# Patient Record
Sex: Female | Born: 1980 | Hispanic: Yes | Marital: Single | Smoking: Former smoker
Health system: Southern US, Community
[De-identification: ages and names within clinical notes are randomized; demographics above are authoritative.]

## PROBLEM LIST (undated history)

## (undated) DIAGNOSIS — Z789 Other specified health status: Secondary | ICD-10-CM

## (undated) DIAGNOSIS — D649 Anemia, unspecified: Secondary | ICD-10-CM

## (undated) HISTORY — PX: NO PAST SURGERIES: SHX2092

## (undated) HISTORY — DX: Anemia, unspecified: D64.9

---

## 2007-06-20 ENCOUNTER — Emergency Department: Payer: Self-pay | Admitting: Emergency Medicine

## 2007-06-30 ENCOUNTER — Emergency Department: Payer: Self-pay | Admitting: Emergency Medicine

## 2010-05-25 ENCOUNTER — Emergency Department: Payer: Self-pay | Admitting: Emergency Medicine

## 2010-05-26 ENCOUNTER — Other Ambulatory Visit: Payer: Self-pay | Admitting: Unknown Physician Specialty

## 2016-12-24 NOTE — L&D Delivery Note (Addendum)
Delivery Note At 3:54 AM a viable and girl  was delivered via Vaginal, Spontaneous Delivery (Presentation: ;  ).  APGAR:8/9 , ; weight  3580 gm .   Placenta status intact: , .  Cord:  with the following complications: .precipitous from 5 cm to delivery . Delivery byRN and I arrived 1 min post delivery    Anesthesia:  none Episiotomy:  none Lacerations: 1st degree Suture Repair: 3.0 vicryl Est. Blood Loss (mL):    Mom to postpartum.  Baby to Couplet care / Skin to Skin.  Cheryl Vasquez 09/30/2017, 4:06 AM

## 2017-03-06 LAB — OB RESULTS CONSOLE ABO/RH: RH Type: POSITIVE

## 2017-03-06 LAB — OB RESULTS CONSOLE ANTIBODY SCREEN: Antibody Screen: NEGATIVE

## 2017-03-06 LAB — OB RESULTS CONSOLE RPR: RPR: NONREACTIVE

## 2017-03-06 LAB — OB RESULTS CONSOLE HGB/HCT, BLOOD: Hemoglobin: 12.4

## 2017-03-06 LAB — OB RESULTS CONSOLE HEPATITIS B SURFACE ANTIGEN: Hepatitis B Surface Ag: NEGATIVE

## 2017-03-07 ENCOUNTER — Other Ambulatory Visit: Payer: Self-pay | Admitting: Advanced Practice Midwife

## 2017-03-07 DIAGNOSIS — Z369 Encounter for antenatal screening, unspecified: Secondary | ICD-10-CM

## 2017-03-07 LAB — OB RESULTS CONSOLE GC/CHLAMYDIA
Chlamydia: NEGATIVE
Gonorrhea: NEGATIVE

## 2017-03-07 LAB — OB RESULTS CONSOLE TSH: TSH: 1.04

## 2017-03-08 ENCOUNTER — Other Ambulatory Visit: Payer: Self-pay | Admitting: Advanced Practice Midwife

## 2017-03-08 DIAGNOSIS — Z369 Encounter for antenatal screening, unspecified: Secondary | ICD-10-CM

## 2017-04-04 ENCOUNTER — Ambulatory Visit: Payer: Self-pay

## 2017-07-18 LAB — OB RESULTS CONSOLE HIV ANTIBODY (ROUTINE TESTING): HIV: NONREACTIVE

## 2017-07-19 LAB — OB RESULTS CONSOLE RPR: RPR: NONREACTIVE

## 2017-08-07 LAB — OB RESULTS CONSOLE HGB/HCT, BLOOD: Hemoglobin: 11.5

## 2017-09-14 LAB — OB RESULTS CONSOLE GBS: GBS: NEGATIVE

## 2017-09-15 LAB — OB RESULTS CONSOLE GC/CHLAMYDIA
CHLAMYDIA, DNA PROBE: NEGATIVE
GC PROBE AMP, GENITAL: NEGATIVE

## 2017-09-30 ENCOUNTER — Encounter: Payer: Self-pay | Admitting: Obstetrics and Gynecology

## 2017-09-30 ENCOUNTER — Inpatient Hospital Stay
Admission: EM | Admit: 2017-09-30 | Discharge: 2017-10-02 | DRG: 807 | Disposition: A | Discharge status: Home / Self Care | Payer: Medicaid Other | Attending: Obstetrics and Gynecology | Admitting: Obstetrics and Gynecology

## 2017-09-30 DIAGNOSIS — Z87891 Personal history of nicotine dependence: Secondary | ICD-10-CM

## 2017-09-30 DIAGNOSIS — Z3A38 38 weeks gestation of pregnancy: Secondary | ICD-10-CM

## 2017-09-30 DIAGNOSIS — O26893 Other specified pregnancy related conditions, third trimester: Secondary | ICD-10-CM | POA: Diagnosis present

## 2017-09-30 DIAGNOSIS — O479 False labor, unspecified: Secondary | ICD-10-CM | POA: Diagnosis present

## 2017-09-30 HISTORY — DX: Other specified health status: Z78.9

## 2017-09-30 LAB — CBC
HEMATOCRIT: 36.4 % (ref 35.0–47.0)
Hemoglobin: 13 g/dL (ref 12.0–16.0)
MCH: 32.4 pg (ref 26.0–34.0)
MCHC: 35.7 g/dL (ref 32.0–36.0)
MCV: 90.9 fL (ref 80.0–100.0)
Platelets: 245 10*3/uL (ref 150–440)
RBC: 4 MIL/uL (ref 3.80–5.20)
RDW: 14.1 % (ref 11.5–14.5)
WBC: 10.7 10*3/uL (ref 3.6–11.0)

## 2017-09-30 LAB — TYPE AND SCREEN
ABO/RH(D): A POS
Antibody Screen: NEGATIVE

## 2017-09-30 MED ORDER — SOD CITRATE-CITRIC ACID 500-334 MG/5ML PO SOLN: 30.0000 mL | ORAL | Status: DC | PRN

## 2017-09-30 MED ORDER — ONDANSETRON HCL 4 MG PO TABS: 4.0000 mg | ORAL_TABLET | ORAL | Status: DC | PRN

## 2017-09-30 MED ORDER — LACTATED RINGERS IV SOLN: 500.0000 mL | INTRAVENOUS | Status: DC | PRN

## 2017-09-30 MED ORDER — ONDANSETRON HCL 4 MG/2ML IJ SOLN: 4.0000 mg | INTRAMUSCULAR | Status: DC | PRN

## 2017-09-30 MED ORDER — OXYCODONE-ACETAMINOPHEN 5-325 MG PO TABS: 2.0000 | ORAL_TABLET | ORAL | Status: DC | PRN

## 2017-09-30 MED ORDER — PRENATAL MULTIVITAMIN CH
1.0000 | ORAL_TABLET | Freq: Every day | ORAL | Status: DC
  Administered 2017-09-30 – 2017-10-02 (×3): 1 via ORAL
  Filled 2017-09-30 (×3): qty 1

## 2017-09-30 MED ORDER — COCONUT OIL OIL: 1.0000 "application " | TOPICAL_OIL | Status: DC | PRN

## 2017-09-30 MED ORDER — MISOPROSTOL 200 MCG PO TABS
ORAL_TABLET | ORAL | Status: AC
  Filled 2017-09-30: qty 4

## 2017-09-30 MED ORDER — ACETAMINOPHEN 325 MG PO TABS
650.0000 mg | ORAL_TABLET | ORAL | Status: DC | PRN
  Administered 2017-09-30: 650 mg via ORAL
  Filled 2017-09-30 (×2): qty 2

## 2017-09-30 MED ORDER — LIDOCAINE HCL (PF) 1 % IJ SOLN: 30.0000 mL | INTRAMUSCULAR | Status: DC | PRN

## 2017-09-30 MED ORDER — DIPHENHYDRAMINE HCL 25 MG PO CAPS: 25.0000 mg | ORAL_CAPSULE | Freq: Four times a day (QID) | ORAL | Status: DC | PRN

## 2017-09-30 MED ORDER — OXYTOCIN BOLUS FROM INFUSION
500.0000 mL | Freq: Once | INTRAVENOUS | Status: AC
  Administered 2017-09-30: 500 mL via INTRAVENOUS

## 2017-09-30 MED ORDER — IBUPROFEN 400 MG PO TABS
600.0000 mg | ORAL_TABLET | Freq: Four times a day (QID) | ORAL | Status: DC
  Administered 2017-09-30 (×2): 600 mg via ORAL
  Administered 2017-10-01: 09:00:00 200 mg via ORAL
  Administered 2017-10-01 (×3): 600 mg via ORAL
  Administered 2017-10-01: 09:00:00 400 mg via ORAL
  Administered 2017-10-02 (×2): 600 mg via ORAL
  Filled 2017-09-30 (×8): qty 1

## 2017-09-30 MED ORDER — SENNOSIDES-DOCUSATE SODIUM 8.6-50 MG PO TABS
2.0000 | ORAL_TABLET | ORAL | Status: DC
  Administered 2017-09-30 – 2017-10-01 (×2): 2 via ORAL
  Filled 2017-09-30 (×2): qty 2

## 2017-09-30 MED ORDER — OXYCODONE-ACETAMINOPHEN 5-325 MG PO TABS: 1.0000 | ORAL_TABLET | ORAL | Status: DC | PRN

## 2017-09-30 MED ORDER — LACTATED RINGERS IV SOLN: INTRAVENOUS | Status: DC

## 2017-09-30 MED ORDER — AMMONIA AROMATIC IN INHA
RESPIRATORY_TRACT | Status: AC
  Filled 2017-09-30: qty 10

## 2017-09-30 MED ORDER — BUTORPHANOL TARTRATE 1 MG/ML IJ SOLN: 1.0000 mg | INTRAMUSCULAR | Status: DC | PRN

## 2017-09-30 MED ORDER — MEASLES, MUMPS & RUBELLA VAC ~~LOC~~ INJ: 0.5000 mL | INJECTION | Freq: Once | SUBCUTANEOUS | Status: DC

## 2017-09-30 MED ORDER — LACTATED RINGERS IV SOLN
INTRAVENOUS | Status: DC
  Administered 2017-09-30: 125 mL/h via INTRAVENOUS

## 2017-09-30 MED ORDER — LIDOCAINE HCL (PF) 1 % IJ SOLN
30.0000 mL | INTRAMUSCULAR | Status: DC | PRN
  Filled 2017-09-30: qty 30

## 2017-09-30 MED ORDER — ONDANSETRON HCL 4 MG/2ML IJ SOLN: 4.0000 mg | Freq: Four times a day (QID) | INTRAMUSCULAR | Status: DC | PRN

## 2017-09-30 MED ORDER — OXYTOCIN 40 UNITS IN LACTATED RINGERS INFUSION - SIMPLE MED: 2.5000 [IU]/h | INTRAVENOUS | Status: DC

## 2017-09-30 MED ORDER — BENZOCAINE-MENTHOL 20-0.5 % EX AERO: 1.0000 "application " | INHALATION_SPRAY | CUTANEOUS | Status: DC | PRN

## 2017-09-30 MED ORDER — OXYTOCIN 40 UNITS IN LACTATED RINGERS INFUSION - SIMPLE MED
2.5000 [IU]/h | INTRAVENOUS | Status: DC
  Filled 2017-09-30: qty 1000

## 2017-09-30 MED ORDER — IBUPROFEN 400 MG PO TABS
600.0000 mg | ORAL_TABLET | Freq: Four times a day (QID) | ORAL | Status: DC
  Administered 2017-09-30: 600 mg via ORAL
  Filled 2017-09-30: qty 1

## 2017-09-30 MED ORDER — OXYTOCIN 10 UNIT/ML IJ SOLN
INTRAMUSCULAR | Status: AC
Start: 2017-09-30 — End: 2017-09-30
  Filled 2017-09-30: qty 2

## 2017-09-30 MED ORDER — SIMETHICONE 80 MG PO CHEW: 80.0000 mg | CHEWABLE_TABLET | ORAL | Status: DC | PRN

## 2017-09-30 MED ORDER — ACETAMINOPHEN 325 MG PO TABS: 650.0000 mg | ORAL_TABLET | ORAL | Status: DC | PRN

## 2017-09-30 MED ORDER — FERROUS SULFATE 325 (65 FE) MG PO TABS
325.0000 mg | ORAL_TABLET | Freq: Two times a day (BID) | ORAL | Status: DC
  Administered 2017-09-30 – 2017-10-02 (×5): 325 mg via ORAL
  Filled 2017-09-30 (×5): qty 1

## 2017-09-30 MED ORDER — DIBUCAINE 1 % RE OINT: 1.0000 "application " | TOPICAL_OINTMENT | RECTAL | Status: DC | PRN

## 2017-09-30 MED ORDER — ZOLPIDEM TARTRATE 5 MG PO TABS: 5.0000 mg | ORAL_TABLET | Freq: Every evening | ORAL | Status: DC | PRN

## 2017-09-30 MED ORDER — WITCH HAZEL-GLYCERIN EX PADS: 1.0000 "application " | MEDICATED_PAD | CUTANEOUS | Status: DC | PRN

## 2017-09-30 MED ORDER — OXYTOCIN BOLUS FROM INFUSION: 500.0000 mL | Freq: Once | INTRAVENOUS | Status: DC

## 2017-09-30 MED ORDER — MAGNESIUM HYDROXIDE 400 MG/5ML PO SUSP: 30.0000 mL | ORAL | Status: DC | PRN

## 2017-09-30 NOTE — OB Triage Note (Signed)
Pt presents to L&D with c/o contractions, rating pain 6/10. Pt triaged with interpreter Vicente Serene, ID: [id number]. Pt denies leaking of fluid or decreased fetal movement. Pt has noted light pink vaginal discharge. Toco and EFM monitors applied. Will monitor patient closely.

## 2017-09-30 NOTE — Progress Notes (Signed)
RN called to bedside at 0352 by patient's daughter stating "she feels like she's coming." RN responded to bedside immediately and noted at 0353 fetal head at +5 station. RN requested for assistance, MD and nursery notified via phone. Pt delivered at 0354, with no maternal pushing effort, RN at bedside.

## 2017-09-30 NOTE — Discharge Summary (Signed)
  Obstetric Discharge Summary   Patient ID: Patient Name: Cheryl Vasquez DOB: 1981/07/22 MRN: [id number]  Date of Admission: 09/30/2017 Date of Discharge: 10/02/17 Primary OB:  [facility]  Gestational Age at Delivery: [redacted]w[redacted]d   Antepartum complications: none Admitting Diagnosis: labor  Secondary Diagnoses: Patient Active Problem List   Diagnosis Date Noted  . Labor and delivery, indication for care 09/30/2017  . Uterine contractions during pregnancy 09/30/2017    Augmentation: none Complications: None Intrapartum complications/course:  Precipitous labor and delivery Date of Delivery: 10/02/2017  Delivered By:RN / Schermerhorn MD Delivery Type: spontaneous vaginal delivery Anesthesia: epidural Placenta: sponatneous Laceration:  Episiotomy: none  Newborn Data: Live born female  Birth Weight: 7 lb 14.3 oz (3580 g) APGAR: 8, 9  Newborn Delivery   Birth date/time:  09/30/2017 03:54:00 Delivery type:  Vaginal, Spontaneous Delivery          Postpartum Course  Patient had an uncomplicated postpartum course.  By time of discharge on PPD#2, her pain was controlled on oral pain medications; she had appropriate lochia and was ambulating, voiding without difficulty and tolerating regular diet.  She was deemed stable for discharge to home.     Labs: CBC Latest Ref Rng & Units 10/01/2017 09/30/2017 08/07/2017  WBC 3.6 - 11.0 K/uL 10.5 10.7 -  Hemoglobin 12.0 - 16.0 g/dL 13.2 44.0 10.2  Hematocrit 35.0 - 47.0 % 38.2 36.4 -  Platelets 150 - 440 K/uL 250 245 -   A POS  Physical exam:  BP (!) 112/56   Pulse 63   Temp 97.6 F (36.4 C) (Oral)   Resp 18   Ht  (1.549 m)   Wt 80.7 kg (178 lb)   SpO2 99%   Breastfeeding? Unknown   BMI 33.63 kg/m  General: alert and no distress Pulm: normal respiratory effort Lochia: appropriate Abdomen: soft, NT Uterine Fundus: firm, below umbilicus Extremities: No evidence of DVT seen on physical exam. No lower extremity  edema.   Disposition: stable, discharge to home Baby Feeding: breastmilk  Baby Disposition: home with mom  Contraception: nexplanon  Prenatal Labs:  ABO, Rh: A/Positive/-- (03/14 0000) Antibody: Negative (03/14 0000) Rubella: immune RPR: Nonreactive (07/27 0000)  HBsAg: Negative (03/14 0000)  HIV: Non-reactive (07/26 0000)  GBS: Negative (09/22 0000)    Plan:  Cheryl Vasquez was discharged to home in good condition. Follow-up appointment at [facility] in 6 weeks  Discharge Instructions: Per After Visit Summary. Activity: Advance as tolerated. Pelvic rest for 6 weeks.  Refer to After Visit Summary Diet: Regular Discharge Medications: Allergies as of 10/02/2017   No Known Allergies     Medication List    TAKE these medications   prenatal multivitamin Tabs tablet Take 1 tablet by mouth daily at 12 noon.      Outpatient follow up:  Follow-up Information    Schermerhorn, Ihor Austin, MD Follow up in 6 week(s).   Specialty:  Obstetrics and Gynecology Contact information: [address] [place] [address] [phone]            Signed:  Elenora Fender Jinna Weinman

## 2017-09-30 NOTE — Progress Notes (Signed)
Pt moved from [facility] to [facility] for admission.

## 2017-09-30 NOTE — H&P (Signed)
Cheryl Vasquez is a 36 y.o. female presenting foractiv elabor  38+5 weeks  OB History    Gravida Para Term Preterm AB Living   SAB TAB Ectopic Multiple Live Births                 Past Medical History:  Diagnosis Date  . Medical history non-contributory    Past Surgical History:  Procedure Laterality Date  . NO PAST SURGERIES     Family History: family history is not on file. Social History:  reports that she quit smoking about 7 months ago. She has never used smokeless tobacco. She reports that she does not drink alcohol or use drugs.     Maternal Diabetes: No Genetic Screening: Normal Maternal Ultrasounds/Referrals: Normal Fetal Ultrasounds or other Referrals:  None Maternal Substance Abuse:  No Significant Maternal Medications:  None Significant Maternal Lab Results:  None Other Comments:  None  ROS History Dilation: 5 Effacement (%): 100 Station: 0 Exam by:: T. Schermerhorn, MD Blood pressure 118/84, pulse 95, temperature 97.9 F (36.6 C), temperature source Oral, resp. rate 16, height  (1.549 m), weight 80.7 kg (178 lb). Exam Physical Exam  LungtsCTA  CV rrr  Abd gravid  EFM : reassuring , no decels  q 2-4 min Prenatal labs: ABO, Rh: A/Positive/-- (03/14 0000) Antibody: Negative (03/14 0000) Rubella: immune RPR: Nonreactive (07/27 0000)  HBsAg: Negative (03/14 0000)  HIV: Non-reactive (07/26 0000)  GBS: Negative (09/22 0000)   Assessment/Plan: Active labor   Anticipate SVD    Ihor Austin Schermerhorn 09/30/2017, 3:25 AM

## 2017-10-01 LAB — CBC
HEMATOCRIT: 38.2 % (ref 35.0–47.0)
HEMOGLOBIN: 13.3 g/dL (ref 12.0–16.0)
MCH: 31.8 pg (ref 26.0–34.0)
MCHC: 34.8 g/dL (ref 32.0–36.0)
MCV: 91.4 fL (ref 80.0–100.0)
Platelets: 250 10*3/uL (ref 150–440)
RBC: 4.18 MIL/uL (ref 3.80–5.20)
RDW: 14.1 % (ref 11.5–14.5)
WBC: 10.5 10*3/uL (ref 3.6–11.0)

## 2017-10-01 LAB — RPR: RPR Ser Ql: NONREACTIVE

## 2017-10-01 NOTE — Progress Notes (Signed)
Post Partum Day 1 Subjective: no complaints, up ad lib, voiding and tolerating PO  Breastfeeding well  Objective: Blood pressure 127/71, pulse 64, temperature 97.8 F (36.6 C), temperature source Oral, resp. rate 18, height  (1.549 m), weight 80.7 kg (178 lb), SpO2 100 %, unknown if currently breastfeeding.  Physical Exam:  General: alert and cooperative  Heart:S1S2, RRR, No M/R/G Lungs:CTA bilat, no W/R/R Lochia: appropriate, no clots Uterine Fundus: firm, U-2 Incision: healing well, no hematoma DVT Evaluation: No evidence of DVT seen on physical exam.   Recent Labs  09/30/17 0324 10/01/17 0427  HGB 13.0 13.3  HCT 36.4 38.2    Assessment/Plan: Plan for discharge tomorrow  Continue breastfeeding Plans Nexplanon PP   LOS: 1 day   Cheryl Vasquez 10/01/2017, 8:50 AM

## 2017-10-02 NOTE — Discharge Instructions (Signed)

## 2017-10-02 NOTE — Progress Notes (Signed)
Discharge instructions reviewed with patient.  All questions answered.

## 2018-12-14 ENCOUNTER — Encounter: Payer: Self-pay | Admitting: Emergency Medicine

## 2018-12-14 ENCOUNTER — Emergency Department
Admission: EM | Admit: 2018-12-14 | Discharge: 2018-12-14 | Disposition: A | Discharge status: Home / Self Care | Payer: Self-pay | Attending: Internal Medicine | Admitting: Internal Medicine

## 2018-12-14 ENCOUNTER — Emergency Department: Payer: PRIVATE HEALTH INSURANCE

## 2018-12-14 DIAGNOSIS — Y9389 Activity, other specified: Secondary | ICD-10-CM | POA: Insufficient documentation

## 2018-12-14 DIAGNOSIS — R079 Chest pain, unspecified: Secondary | ICD-10-CM | POA: Insufficient documentation

## 2018-12-14 DIAGNOSIS — Y999 Unspecified external cause status: Secondary | ICD-10-CM | POA: Insufficient documentation

## 2018-12-14 DIAGNOSIS — W500XXA Accidental hit or strike by another person, initial encounter: Secondary | ICD-10-CM | POA: Insufficient documentation

## 2018-12-14 DIAGNOSIS — S46912A Strain of unspecified muscle, fascia and tendon at shoulder and upper arm level, left arm, initial encounter: Secondary | ICD-10-CM | POA: Insufficient documentation

## 2018-12-14 DIAGNOSIS — Y9259 Other trade areas as the place of occurrence of the external cause: Secondary | ICD-10-CM | POA: Insufficient documentation

## 2018-12-14 DIAGNOSIS — Z87891 Personal history of nicotine dependence: Secondary | ICD-10-CM | POA: Insufficient documentation

## 2018-12-14 MED ORDER — KETOROLAC TROMETHAMINE 30 MG/ML IJ SOLN
30.0000 mg | Freq: Once | INTRAMUSCULAR | Status: AC
  Administered 2018-12-14: 30 mg via INTRAMUSCULAR
  Filled 2018-12-14: qty 1

## 2018-12-14 MED ORDER — NAPROXEN 500 MG PO TABS: 500.0000 mg | ORAL_TABLET | Freq: Two times a day (BID) | ORAL | 0 refills | Status: DC
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
## 2020-06-23 DIAGNOSIS — J40 Bronchitis, not specified as acute or chronic: Secondary | ICD-10-CM

## 2020-06-23 HISTORY — DX: Bronchitis, not specified as acute or chronic: J40

## 2020-09-23 ENCOUNTER — Ambulatory Visit (LOCAL_COMMUNITY_HEALTH_CENTER): Payer: Self-pay | Admitting: Physician Assistant

## 2020-09-23 ENCOUNTER — Other Ambulatory Visit: Payer: Self-pay

## 2020-09-23 ENCOUNTER — Encounter: Payer: Self-pay | Admitting: Physician Assistant

## 2020-09-23 VITALS — BP 118/82 | Ht 61.5 in | Wt 169.0 lb

## 2020-09-23 DIAGNOSIS — B9689 Other specified bacterial agents as the cause of diseases classified elsewhere: Secondary | ICD-10-CM

## 2020-09-23 DIAGNOSIS — Z113 Encounter for screening for infections with a predominantly sexual mode of transmission: Secondary | ICD-10-CM

## 2020-09-23 DIAGNOSIS — Z01419 Encounter for gynecological examination (general) (routine) without abnormal findings: Secondary | ICD-10-CM

## 2020-09-23 DIAGNOSIS — Z30011 Encounter for initial prescription of contraceptive pills: Secondary | ICD-10-CM

## 2020-09-23 DIAGNOSIS — Z3009 Encounter for other general counseling and advice on contraception: Secondary | ICD-10-CM

## 2020-09-23 DIAGNOSIS — N760 Acute vaginitis: Secondary | ICD-10-CM

## 2020-09-23 LAB — WET PREP FOR TRICH, YEAST, CLUE
Trichomonas Exam: NEGATIVE
Yeast Exam: NEGATIVE

## 2020-09-23 MED ORDER — METRONIDAZOLE 500 MG PO TABS: 500.0000 mg | ORAL_TABLET | Freq: Two times a day (BID) | ORAL | 0 refills | Status: AC

## 2020-09-23 MED ORDER — NORGESTIMATE-ETH ESTRADIOL 0.25-35 MG-MCG PO TABS: 1.0000 | ORAL_TABLET | Freq: Every day | ORAL | 5 refills | Status: DC

## 2020-09-23 NOTE — Progress Notes (Signed)
[facility] DEPARTMENT [place] [facility] [address] Main Number: [phone]    Family Planning Visit- Initial Visit  Subjective:  Cheryl Vasquez is a 39 y.o.  [phone]   being seen today for an initial well woman visit and to discuss family planning options.  She is currently using Condoms for pregnancy prevention. Patient reports she does not want a pregnancy in the next year.  Patient has the following medical conditions has Labor and delivery, indication for care and Uterine contractions during pregnancy on their problem list.  Chief Complaint  Patient presents with  . Contraception    PE and start OCs    Patient reports that she has decided that she would like to use OCs and think about a longer term option.  States that she has used OCs, Depo, Nexplanon and the ring in the past without problems except that she did not like not having a period.  Per patient the last time she was seen here for a visit was in October of 2010.  Is not sure when her last pap was done.  Reports that she has had a yellowish vaginal discharge for 1 week and requests STD screening.   Patient denies any other concerns today.   Body mass index is 31.42 kg/m. - Patient is eligible for diabetes screening based on BMI and age >69?  yes HA1C ordered? yes  Patient reports 2 partners in last year. Desires STI screening?  Yes  Has patient been screened once for HCV in the past?  No  No results found for: HCVAB  Does the patient have current drug use (including MJ), have a partner with drug use, and/or has been incarcerated since last result? No  If yes-- Screen for HCV through [facility]   Does the patient meet criteria for HBV testing? No  Criteria:  -Household, sexual or needle sharing contact with HBV -History of drug use -HIV positive -Those with known Hep C   Health Maintenance Due  Topic Date Due  . Hepatitis C Screening  Never done  . COVID-19  Vaccine (1) Never done  . TETANUS/TDAP  Never done  . PAP SMEAR-Modifier  Never done  . INFLUENZA VACCINE  Never done    Review of Systems  All other systems reviewed and are negative.   The following portions of the patient's history were reviewed and updated as appropriate: allergies, current medications, past family history, past medical history, past social history, past surgical history and problem list. Problem list updated.   See flowsheet for other program required questions.  Objective:   Vitals:   09/23/20 1539 09/23/20 1752  BP: 121/88 118/82  Weight: 169 lb (76.7 kg)   Height: 5' 1.5" (1.562 m)     Physical Exam Vitals reviewed.  Constitutional:      General: She is not in acute distress.    Appearance: Normal appearance.  HENT:     Head: Normocephalic and atraumatic.     Mouth/Throat:     Mouth: Mucous membranes are moist.     Pharynx: Oropharynx is clear. No oropharyngeal exudate or posterior oropharyngeal erythema.  Eyes:     Conjunctiva/sclera: Conjunctivae normal.  Neck:     Thyroid: No thyroid mass, thyromegaly or thyroid tenderness.  Cardiovascular:     Rate and Rhythm: Normal rate and regular rhythm.  Pulmonary:     Effort: Pulmonary effort is normal.     Breath sounds: Normal breath sounds.  Chest:  Breasts:        Right: Normal. No mass, nipple discharge, skin change or tenderness.        Left: Normal. No mass, nipple discharge, skin change or tenderness.  Abdominal:     Palpations: Abdomen is soft. There is no mass.     Tenderness: There is no abdominal tenderness. There is no guarding or rebound.  Genitourinary:    General: Normal vulva.     Rectum: Normal.     Comments: External genitalia/pubic area without nits, lice, edema, erythema, lesions and inguinal adenopathy. Vagina with normal mucosa and small amount of grayish discharge, pH=>4.5. Cervix without visible lesions. Uterus firm, mobile, nt, no masses, no CMT, no adnexal  tenderness or fullness. Musculoskeletal:     Cervical back: Neck supple. No tenderness.  Lymphadenopathy:     Cervical: No cervical adenopathy.     Upper Body:     Right upper body: No supraclavicular, axillary or pectoral adenopathy.     Left upper body: No supraclavicular, axillary or pectoral adenopathy.  Skin:    General: Skin is warm and dry.     Findings: No bruising, erythema, lesion or rash.  Neurological:     Mental Status: She is alert and oriented to person, place, and time.  Psychiatric:        Mood and Affect: Mood normal.        Behavior: Behavior normal.        Thought Content: Thought content normal.        Judgment: Judgment normal.       Assessment and Plan:  Cheryl Vasquez is a 39 y.o. female presenting to the [facility] Department for an initial well woman exam/family planning visit  Contraception counseling: Reviewed all forms of birth control options in the tiered based approach. available including abstinence; over the counter/barrier methods; hormonal contraceptive medication including pill, patch, ring, injection,contraceptive implant, ECP; hormonal and nonhormonal IUDs; permanent sterilization options including vasectomy and the various tubal sterilization modalities. Risks, benefits, and typical effectiveness rates were reviewed.  Questions were answered.  Written information was also given to the patient to review.  Patient desires to start OCs while researching more about LARCs, this was prescribed for patient. She will follow up in  4-5 month and prn for surveillance.  She was told to call with any further questions, or with any concerns about this method of contraception.  Emphasized use of condoms 100% of the time for STI prevention.  Patient was not a candidate for ECP today.  1. Encounter for counseling regarding contraception Counseled patient as above about all BCMs.   Reviewed with patient nl SE of OCs and when to call  clinic for irregular bleeding and SE when to d/c and call clinic. Rec condoms with all sex for 2 weeks of first cycle and then if late OCs.  2. Screening for STD (sexually transmitted disease) Await results.  Counseled that RN will call if needs to RTC for treatment once results are back.  - WET PREP FOR TRICH, YEAST, CLUE - Chlamydia/Gonorrhea [facility] - HIV/HCV [facility] - Syphilis Serology, [facility]  3. Well woman exam with routine gynecological exam Reviewed with patient healthy habits to maintain general health. Enc MVI 1 po daily. Enc to establish with/ follow up with PCP for primary care concerns, age appropriate screenings and illness. Await pap and Hgb A1c results.  Counseled that RN will call or send letter about results and  follow up recommendations. - IGP, Aptima HPV - Hgb A1c w/o eAG  4. OCP (oral contraceptive pills) initiation OK to start Sprintec 28 d 1 po daily at the same time daily at the onset of next menses.  Patient not able to receive 13 packs of OC due to supply and expiration dates.  #5 given today and patient to call when she starts last cycle to RTC for pill supply or before then if she decides she would like to start a LARC.  - norgestimate-ethinyl estradiol (SPRINTEC 28) 0.25-35 MG-MCG tablet; Take 1 tablet by mouth daily.  Dispense: 28 tablet; Refill: 5  5. BV (bacterial vaginosis) Will treat BV with Metronidazole 500 mg # 14 1 po BID for 7 days with food, no EtOH for 24 hr before and until 72 hr after completing medicine. No sex for 7 days. Enc to use OTC antifungal cream if has itching during or just after completing antibiotic. - metroNIDAZOLE (FLAGYL) 500 MG tablet; Take 1 tablet (500 mg total) by mouth 2 (two) times daily for 7 days.  Dispense: 14 tablet; Refill: 0   Return in about 4 months (around 01/24/2021) for pill supply, prn and 1 year for RP .  No future appointments.  Matt Holmes, PA

## 2020-09-24 LAB — HGB A1C W/O EAG: Hgb A1c MFr Bld: 5.2 % (ref 4.8–5.6)

## 2020-09-25 NOTE — Progress Notes (Signed)
Chart reviewed by Pharmacist  Suzanne Walker PharmD, Contract Pharmacist at [facility]  

## 2020-09-29 LAB — IGP, APTIMA HPV
HPV Aptima: NEGATIVE
PAP Smear Comment: 0

## 2020-10-04 ENCOUNTER — Telehealth: Payer: Self-pay | Admitting: Family Medicine

## 2020-10-04 ENCOUNTER — Encounter: Payer: Self-pay | Admitting: Family Medicine

## 2020-10-04 LAB — HM HEPATITIS C SCREENING LAB: HM Hepatitis Screen: NEGATIVE

## 2020-10-04 LAB — HM HIV SCREENING LAB: HM HIV Screening: NEGATIVE

## 2020-10-04 NOTE — Telephone Encounter (Signed)
Call to patient to discuss positive TR.  Patient verified by password.  Patient informed of + CT result.  Patient schedueld for treatment appointment.  Patient verbalizes understanding at this time and has no further questions. Xavier Fournier, RN  

## 2020-10-04 NOTE — Telephone Encounter (Signed)
Call to patient to inform of positive chlamydia infection.  No answer, left message on machine with interperter for patient to return call.   Wendi Snipes, RN

## 2020-10-06 ENCOUNTER — Other Ambulatory Visit: Payer: Self-pay

## 2020-10-06 ENCOUNTER — Ambulatory Visit: Payer: Self-pay | Admitting: Physician Assistant

## 2020-10-06 DIAGNOSIS — A749 Chlamydial infection, unspecified: Secondary | ICD-10-CM

## 2020-10-06 MED ORDER — AZITHROMYCIN 500 MG PO TABS
1000.0000 mg | ORAL_TABLET | Freq: Once | ORAL | Status: AC
Start: 2020-10-06 — End: 2020-10-06
  Administered 2020-10-06: 1000 mg via ORAL

## 2020-10-06 NOTE — Progress Notes (Signed)
Patient scheduled appt for partner tx this afternoon .Richmond Campbell, RN

## 2020-10-06 NOTE — Progress Notes (Signed)
Patient into clinic for treatment of Chlamydia.  RN saw patient, see her note.

## 2021-03-29 ENCOUNTER — Other Ambulatory Visit: Payer: Self-pay

## 2021-03-29 ENCOUNTER — Ambulatory Visit (LOCAL_COMMUNITY_HEALTH_CENTER): Payer: Self-pay

## 2021-03-29 VITALS — BP 108/71 | Ht 61.0 in | Wt 170.5 lb

## 2021-03-29 DIAGNOSIS — Z3201 Encounter for pregnancy test, result positive: Secondary | ICD-10-CM

## 2021-03-29 LAB — PREGNANCY, URINE: Preg Test, Ur: POSITIVE — AB

## 2021-03-29 MED ORDER — PRENATAL 27-0.8 MG PO TABS: 1.0000 | ORAL_TABLET | Freq: Every day | ORAL | 0 refills | Status: DC

## 2021-03-29 NOTE — Progress Notes (Addendum)
UPT positive. Declines interpreter today. Able to speak and understand English during visit, preferred reading material in [place]. Plans prenatal care [facility]. Consult E. Sciora, CNM d/t hx ectopic preg.Provider advises pt to establish prenatal care soon and to seek immediate medical attn/go to ER if increased abd pain and bleeding. Pt verbalizes understanding. To clerk for preadmit. Jerel Shepherd, RN   Consulted on the plan of care for this client.  I agree with the documented note and actions taken to provide care for this client.  Hazle Coca, CNM

## 2021-04-17 ENCOUNTER — Encounter: Payer: Self-pay | Admitting: Advanced Practice Midwife

## 2021-04-18 NOTE — Progress Notes (Signed)
OB abstract completed per telephone interview with patient. Henriette Combs RN

## 2021-04-25 ENCOUNTER — Other Ambulatory Visit: Payer: Self-pay

## 2021-04-25 ENCOUNTER — Ambulatory Visit: Payer: Medicaid Other | Admitting: Family Medicine

## 2021-04-25 VITALS — BP 117/79 | HR 76 | Temp 97.2°F | Wt 171.6 lb

## 2021-04-25 DIAGNOSIS — Z23 Encounter for immunization: Secondary | ICD-10-CM | POA: Diagnosis not present

## 2021-04-25 DIAGNOSIS — O99210 Obesity complicating pregnancy, unspecified trimester: Secondary | ICD-10-CM

## 2021-04-25 DIAGNOSIS — O09521 Supervision of elderly multigravida, first trimester: Secondary | ICD-10-CM | POA: Diagnosis not present

## 2021-04-25 DIAGNOSIS — O0990 Supervision of high risk pregnancy, unspecified, unspecified trimester: Secondary | ICD-10-CM | POA: Insufficient documentation

## 2021-04-25 DIAGNOSIS — O99211 Obesity complicating pregnancy, first trimester: Secondary | ICD-10-CM | POA: Diagnosis not present

## 2021-04-25 DIAGNOSIS — O09529 Supervision of elderly multigravida, unspecified trimester: Secondary | ICD-10-CM

## 2021-04-25 LAB — URINALYSIS
Bilirubin, UA: NEGATIVE
Glucose, UA: NEGATIVE
Ketones, UA: NEGATIVE
Leukocytes,UA: NEGATIVE
Nitrite, UA: NEGATIVE
Protein,UA: NEGATIVE
RBC, UA: NEGATIVE
Specific Gravity, UA: 1.02 (ref 1.005–1.030)
Urobilinogen, Ur: 0.2 mg/dL (ref 0.2–1.0)
pH, UA: 7.5 (ref 5.0–7.5)

## 2021-04-25 LAB — WET PREP FOR TRICH, YEAST, CLUE
Trichomonas Exam: NEGATIVE
Yeast Exam: NEGATIVE

## 2021-04-25 LAB — HEMOGLOBIN, FINGERSTICK: Hemoglobin: 12.5 g/dL (ref 11.1–15.9)

## 2021-04-25 NOTE — Progress Notes (Addendum)
[facility] HEALTH DEPT [facility] [address] [place] [address] [place] [address] [phone]  INITIAL PRENATAL VISIT NOTE  Subjective:  Cheryl Vasquez is a 40 y.o. [id number] at [redacted]w[redacted]d being seen today to start prenatal care at the [facility].  She is currently monitored for the following issues for this high-risk pregnancy and has Labor and delivery, indication for care; Uterine contractions during pregnancy; and High-risk pregnancy, multigravida of advanced maternal age, antepartum on their problem list.  Patient reports no complaints.  Contractions: Not present. Vag. Bleeding: None.  Movement: Absent. Denies leaking of fluid.   Indications for ASA therapy (per uptodate) One of the following: Previous pregnancy with preeclampsia, especially early onset and with an adverse outcome No Multifetal gestation No Chronic hypertension No Type 1 or 2 diabetes mellitus No Chronic kidney disease No Autoimmune disease (antiphospholipid syndrome, systemic lupus erythematosus) No  Two or more of the following: Nulliparity No Obesity (body mass index >30 kg/m2) Q Qq Q   Family history of preeclampsia in mother or sister Yes Age ?35 years Yes Sociodemographic characteristics (African American race, low socioeconomic level) Yes Personal risk factors (eg, previous pregnancy with low birth weight or small for gestational age infant, previous adverse pregnancy outcome [eg, stillbirth], interval >10 years between pregnancies) No   The following portions of the patient's history were reviewed and updated as appropriate: allergies, current medications, past family history, past medical history, past social history, past surgical history and problem list. Problem list updated.  Objective:   Vitals:   04/25/21 0836  BP: 117/79  Pulse: 76  Temp: (!) 97.2 F (36.2 C)  Weight: 171 lb 9.6 oz (77.8 kg)    Fetal Status: Fetal Heart  Rate (bpm): 162 Fundal Height: 11 cm Movement: Absent  Presentation: Undeterminable  Physical Exam Vitals and nursing note reviewed.  Constitutional:      General: She is not in acute distress.    Appearance: Normal appearance. She is well-developed.  HENT:     Head: Normocephalic and atraumatic.     Right Ear: External ear normal.     Left Ear: External ear normal.     Nose: Nose normal. No congestion or rhinorrhea.     Mouth/Throat:     Lips: Pink.     Mouth: Mucous membranes are moist.     Dentition: Normal dentition. No dental caries.     Pharynx: Oropharynx is clear. Uvula midline.     Comments: Dentition: no visible caries  Eyes:     General: No scleral icterus.    Conjunctiva/sclera: Conjunctivae normal.  Neck:     Thyroid: No thyroid mass, thyromegaly or thyroid tenderness.  Cardiovascular:     Rate and Rhythm: Normal rate.     Pulses: Normal pulses.     Comments: Extremities are warm and well perfused Pulmonary:     Effort: Pulmonary effort is normal.     Breath sounds: Normal breath sounds.  Chest:     Chest wall: No mass.  Breasts:     Tanner Score is 5. Breasts are symmetrical.     Right: Normal. No mass, nipple discharge, skin change or axillary adenopathy.     Left: Normal. No mass, nipple discharge, skin change or axillary adenopathy.    Abdominal:     General: Abdomen is flat.     Palpations: Abdomen is soft.     Tenderness: There is no abdominal tenderness.     Comments: Gravid  Genitourinary:    General: Normal vulva.     Exam position: Lithotomy position.     Pubic Area: No rash.      Labia:        Right: No rash.        Left: No rash.      Vagina: Normal. No vaginal discharge.     Cervix: No cervical motion tenderness or friability.     Uterus: Normal. Enlarged (Gravid 12 size). Not tender.      Adnexa: Right adnexa normal and left adnexa normal.     Rectum: Normal. No external hemorrhoid.  Musculoskeletal:     Right lower leg: No edema.      Left lower leg: No edema.  Lymphadenopathy:     Cervical: No cervical adenopathy.     Upper Body:     Right upper body: No axillary adenopathy.     Left upper body: No axillary adenopathy.  Skin:    General: Skin is warm.     Capillary Refill: Capillary refill takes less than 2 seconds.  Neurological:     Mental Status: She is alert and oriented to person, place, and time.  Psychiatric:        Behavior: Behavior normal.     Assessment and Plan:  Pregnancy: [id number] at [redacted]w[redacted]d  1. Supervision of high risk pregnancy, antepartum  - Dating: 02/01/21 LMP, sure of LMP , no U/S, no ED visit  - Genetic screening: denies any family history of abnormalities, desires first screen.   - Pregnancy sx: reports nausea, dizziness when walking and headaches 1-2 x weekly, information sheet given with approved pregnancy medications, nausea prevention and encouraged to increase hydration and protein.  Denies any other symptoms at this time.   - ~ 1 yr ago was last  dental care, encourage to continue routine dental care. Reviewed safety of routine care in pregnancy  - Has minor support system from family, FOB left when found out about pregnancy.   - Vaccinations: previous covid vaccines, flu vaccine given today.   - Has two minor risk factors for preeclampsia. Recommended ASA 81mg  daily for preeclampsia prevention. Discussed starting at 11-13 weeks and continuing through pregnancy. We will touch base at next appointment to see if she started this medication.  -no previous hx of cesarean  LBW or PTL  Last pap 09/23/2020 next due 09/24/2023  - Routine labs today  - Prenatal profile without Varicella/Rubella 11/24/2023) - HIV Antibody (routine testing w rflx) - HCV Ab w Reflex to Quant PCR - Urine Culture - Chlamydia/GC NAA, Confirmation - Glucose, 1 hour gestational - Comprehensive metabolic panel - TSH - Protein / creatinine ratio, urine  (Spot) - Lead, blood (adult age 74 yrs or greater) - Hgb A1c w/o  eAG - Urinalysis (Urine Dip) - WET PREP FOR TRICH, YEAST, CLUE - Hemoglobin, venipuncture - 12 7+Oxycodone-Bund - HIV-1/HIV-2 Qualitative RNA  2. High-risk pregnancy, multigravida of advanced maternal age, antepartum  3. Obesity affecting pregnancy, antepartum  - Amb ref to Medical Nutrition Therapy-MNT    Preterm labor symptoms and general obstetric precautions including but not limited to vaginal bleeding, contractions, leaking of fluid and fetal movement were reviewed in detail with the patient.  Please refer to After Visit Summary for other counseling recommendations.   Return in about 4 weeks (around 05/23/2021).  No future appointments.  05/25/2021, FNP

## 2021-04-25 NOTE — Progress Notes (Addendum)
Hgb, UA and wet mount results reviewed. Per standing orders no treatment indicated. Tawny Hopping, RN

## 2021-04-25 NOTE — Progress Notes (Signed)
Here today for 11.6 week MH IP. Taking PNV QD. Denies ED/hospital visits since +PT. Flu vaccine given. Needs early 1hgtt. Tawny Hopping, RN

## 2021-04-26 LAB — 789231 7+OXYCODONE-BUND
Amphetamines, Urine: NEGATIVE ng/mL
BENZODIAZ UR QL: NEGATIVE ng/mL
Barbiturate screen, urine: NEGATIVE ng/mL
Cannabinoid Quant, Ur: NEGATIVE ng/mL
Cocaine (Metab.): NEGATIVE ng/mL
OPIATE SCREEN URINE: NEGATIVE ng/mL
Oxycodone/Oxymorphone, Urine: NEGATIVE ng/mL
PCP Quant, Ur: NEGATIVE ng/mL

## 2021-04-26 LAB — PROTEIN / CREATININE RATIO, URINE
Creatinine, Urine: 88.4 mg/dL
Protein, Ur: 10.7 mg/dL
Protein/Creat Ratio: 121 mg/g creat (ref 0–200)

## 2021-04-26 LAB — LEAD, BLOOD (ADULT >= 16 YRS): Lead-Whole Blood: <1 ug/dL (ref 0–4)

## 2021-04-26 NOTE — Progress Notes (Signed)
Per [facility], client has the following appts: 05/03/2021 0800 genetic counseling via phone call and 05/04/2021 1430 [place] at [facility]. Jossie Ng, RN

## 2021-04-27 LAB — CBC/D/PLT+RPR+RH+ABO+AB SCR
Antibody Screen: NEGATIVE
Basophils Absolute: 0 10*3/uL (ref 0.0–0.2)
Basos: 0 %
EOS (ABSOLUTE): 0 10*3/uL (ref 0.0–0.4)
Eos: 0 %
Hematocrit: 37.2 % (ref 34.0–46.6)
Hemoglobin: 12.5 g/dL (ref 11.1–15.9)
Hepatitis B Surface Ag: NEGATIVE
Immature Grans (Abs): 0.1 10*3/uL (ref 0.0–0.1)
Immature Granulocytes: 1 %
Lymphocytes Absolute: 1.8 10*3/uL (ref 0.7–3.1)
Lymphs: 25 %
MCH: 30.9 pg (ref 26.6–33.0)
MCHC: 33.6 g/dL (ref 31.5–35.7)
MCV: 92 fL (ref 79–97)
Monocytes Absolute: 0.5 10*3/uL (ref 0.1–0.9)
Monocytes: 6 %
Neutrophils Absolute: 4.9 10*3/uL (ref 1.4–7.0)
Neutrophils: 68 %
Platelets: 308 10*3/uL (ref 150–450)
RBC: 4.05 x10E6/uL (ref 3.77–5.28)
RDW: 12.5 % (ref 11.7–15.4)
RPR Ser Ql: NONREACTIVE
Rh Factor: POSITIVE
WBC: 7.2 10*3/uL (ref 3.4–10.8)

## 2021-04-27 LAB — GLUCOSE, 1 HOUR GESTATIONAL: Gestational Diabetes Screen: 96 mg/dL (ref 65–139)

## 2021-04-27 LAB — HCV AB W REFLEX TO QUANT PCR: HCV Ab: <0.1 s/co ratio (ref 0.0–0.9)

## 2021-04-27 LAB — COMPREHENSIVE METABOLIC PANEL
ALT: 12 IU/L (ref 0–32)
AST: 12 IU/L (ref 0–40)
Albumin/Globulin Ratio: 1.6 (ref 1.2–2.2)
Albumin: 4.2 g/dL (ref 3.8–4.8)
Alkaline Phosphatase: 93 IU/L (ref 44–121)
BUN/Creatinine Ratio: 18 (ref 9–23)
BUN: 8 mg/dL (ref 6–24)
Bilirubin Total: 0.2 mg/dL (ref 0.0–1.2)
CO2: 19 mmol/L — ABNORMAL LOW (ref 20–29)
Calcium: 8.8 mg/dL (ref 8.7–10.2)
Chloride: 100 mmol/L (ref 96–106)
Creatinine, Ser: 0.45 mg/dL — ABNORMAL LOW (ref 0.57–1.00)
Globulin, Total: 2.6 g/dL (ref 1.5–4.5)
Glucose: 102 mg/dL — ABNORMAL HIGH (ref 65–99)
Potassium: 3.9 mmol/L (ref 3.5–5.2)
Sodium: 135 mmol/L (ref 134–144)
Total Protein: 6.8 g/dL (ref 6.0–8.5)
eGFR: 123 mL/min/{1.73_m2} (ref >59)

## 2021-04-27 LAB — HIV-1/HIV-2 QUALITATIVE RNA
HIV-1 RNA, Qualitative: NONREACTIVE
HIV-2 RNA, Qualitative: NONREACTIVE

## 2021-04-27 LAB — CHLAMYDIA/GC NAA, CONFIRMATION
Chlamydia trachomatis, NAA: NEGATIVE
Neisseria gonorrhoeae, NAA: NEGATIVE

## 2021-04-27 LAB — HGB A1C W/O EAG: Hgb A1c MFr Bld: 5.2 % (ref 4.8–5.6)

## 2021-04-27 LAB — HCV INTERPRETATION

## 2021-04-27 LAB — TSH: TSH: 0.226 u[IU]/mL — ABNORMAL LOW (ref 0.450–4.500)

## 2021-05-01 DIAGNOSIS — O99210 Obesity complicating pregnancy, unspecified trimester: Secondary | ICD-10-CM | POA: Insufficient documentation

## 2021-05-01 DIAGNOSIS — E669 Obesity, unspecified: Secondary | ICD-10-CM | POA: Insufficient documentation

## 2021-05-01 LAB — URINE CULTURE

## 2021-05-02 ENCOUNTER — Encounter: Payer: Self-pay | Admitting: Family Medicine

## 2021-05-02 DIAGNOSIS — O09529 Supervision of elderly multigravida, unspecified trimester: Secondary | ICD-10-CM | POA: Insufficient documentation

## 2021-05-08 ENCOUNTER — Telehealth: Payer: Self-pay

## 2021-05-08 NOTE — Telephone Encounter (Signed)
Call to client to schedule follow-up appt in [facility] as no future appts scheduled. Client requested appt on a Thursday and scheduled for 05/25/2021 with arrival time of 0800. [employer] ID # [id number] used during call. Jossie Ng, RN

## 2021-05-25 ENCOUNTER — Other Ambulatory Visit: Payer: Self-pay

## 2021-05-25 ENCOUNTER — Encounter: Payer: Self-pay | Admitting: Physician Assistant

## 2021-05-25 ENCOUNTER — Ambulatory Visit: Payer: Medicaid Other | Admitting: Physician Assistant

## 2021-05-25 VITALS — BP 107/66 | HR 82 | Temp 98.1°F | Wt 172.0 lb

## 2021-05-25 DIAGNOSIS — O99212 Obesity complicating pregnancy, second trimester: Secondary | ICD-10-CM

## 2021-05-25 DIAGNOSIS — O0990 Supervision of high risk pregnancy, unspecified, unspecified trimester: Secondary | ICD-10-CM

## 2021-05-25 DIAGNOSIS — O0992 Supervision of high risk pregnancy, unspecified, second trimester: Secondary | ICD-10-CM | POA: Diagnosis not present

## 2021-05-25 DIAGNOSIS — R7989 Other specified abnormal findings of blood chemistry: Secondary | ICD-10-CM | POA: Diagnosis not present

## 2021-05-25 DIAGNOSIS — O99210 Obesity complicating pregnancy, unspecified trimester: Secondary | ICD-10-CM

## 2021-05-25 DIAGNOSIS — O09529 Supervision of elderly multigravida, unspecified trimester: Secondary | ICD-10-CM

## 2021-05-25 NOTE — Progress Notes (Signed)
   PRENATAL VISIT NOTE  Subjective:  Cheryl Vasquez is a 40 y.o. [phone] at [redacted]w[redacted]d being seen today for ongoing prenatal care.  She is currently monitored for the following issues for this high-risk pregnancy and has Supervision of high risk pregnancy, antepartum; Obesity affecting pregnancy, antepartum; Advanced maternal age in multigravida; and Abnormal thyroid blood test on their problem list.  Patient reports some varicose vein discomfort in legs.  Contractions: Not present. Vag. Bleeding: None.  Movement: Absent. Denies leaking of fluid/ROM.   The following portions of the patient's history were reviewed and updated as appropriate: allergies, current medications, past family history, past medical history, past social history, past surgical history and problem list. Problem list updated.  Objective:   Vitals:   05/25/21 0826  BP: 107/66  Pulse: 82  Temp: 98.1 F (36.7 C)  Weight: 172 lb (78 kg)    Fetal Status: Fetal Heart Rate (bpm): 152 Fundal Height: 16 cm Movement: Absent     General:  Alert, oriented and cooperative. Patient is in no acute distress.  Skin: Skin is warm and dry. No rash noted.   Cardiovascular: Normal heart rate noted  Respiratory: Normal respiratory effort, no problems with respiration noted  Abdomen: Soft, gravid, appropriate for gestational age.  Pain/Pressure: Absent     Pelvic: Cervical exam deferred        Extremities: Normal range of motion.  Edema: None  Mental Status: Normal mood and affect. Normal behavior. Normal judgment and thought content.   Assessment and Plan:  Pregnancy: [id number] at [redacted]w[redacted]d  1. Supervision of high risk pregnancy, antepartum AFP only today. Schedule anatomy US at 19-20 wk (before June 30). Encouraged support stockings for varicose vein discomfort. Reports has had initial 2-vaccine COVID series, unsure of dates - to bring vaccine card to next visit to see if booster is due.  2. Abnormal thyroid blood test TSH  decreased at initial visit. Check TSH, T3 and free T4 today.  3. Obesity affecting pregnancy, antepartum Taking daily low dose aspirin as instructed.  4. Antepartum multigravida of advanced maternal age Continue aspirin.  Due to language barrier, an interpreter Juliene Pina) was present during the visit with this patient.  Preterm labor symptoms and general obstetric precautions including but not limited to vaginal bleeding, contractions, leaking of fluid and fetal movement were reviewed in detail with the patient. Please refer to After Visit Summary for other counseling recommendations.  Return in about 4 weeks (around 06/22/2021) for Routine prenatal care.  No future appointments.  Landry Dyke, PA-C

## 2021-05-25 NOTE — Progress Notes (Signed)
Patient here for MH RV at 16 1/7. [facility] pager card given. AFP only today. Needs TSH, T3 and Free T4. Marland KitchenBurt Knack, RN

## 2021-05-25 NOTE — Progress Notes (Signed)
[facility] U/S referral faxed with confirmation. Patient aware to expect call from [facility]. Patient states her Medicaid ends June 30. Counseled that if [facility] U/S is after that date, she can ask at her next appt for a letter, to take to [facility] for a lower cost U/S.Marland KitchenBurt Knack, RN

## 2021-05-26 LAB — TSH+FREE T4
Free T4: 1 ng/dL (ref 0.82–1.77)
TSH: 1.34 u[IU]/mL (ref 0.450–4.500)

## 2021-05-26 LAB — T3: T3, Total: 272 ng/dL — ABNORMAL HIGH (ref 71–180)

## 2021-05-27 LAB — AFP, SERUM, OPEN SPINA BIFIDA
AFP MoM: 1.26
AFP Value: 40.7 ng/mL
Gest. Age on Collection Date: 16.1 weeks
Maternal Age At EDD: 42.6 yr
OSBR Risk 1 IN: 5411
Test Results:: NEGATIVE
Weight: 172 [lb_av]

## 2021-06-06 NOTE — Addendum Note (Signed)
Addended by: Heywood Bene on: 06/06/2021 12:02 PM   Modules accepted: Orders

## 2021-06-22 ENCOUNTER — Encounter: Payer: Self-pay | Admitting: Physician Assistant

## 2021-06-22 ENCOUNTER — Other Ambulatory Visit: Payer: Self-pay

## 2021-06-22 ENCOUNTER — Ambulatory Visit: Payer: Medicaid Other | Admitting: Physician Assistant

## 2021-06-22 VITALS — BP 106/62 | HR 82 | Temp 82.0°F | Wt 177.2 lb

## 2021-06-22 DIAGNOSIS — O0992 Supervision of high risk pregnancy, unspecified, second trimester: Secondary | ICD-10-CM | POA: Diagnosis not present

## 2021-06-22 DIAGNOSIS — O0990 Supervision of high risk pregnancy, unspecified, unspecified trimester: Secondary | ICD-10-CM

## 2021-06-22 DIAGNOSIS — O99212 Obesity complicating pregnancy, second trimester: Secondary | ICD-10-CM

## 2021-06-22 DIAGNOSIS — O09529 Supervision of elderly multigravida, unspecified trimester: Secondary | ICD-10-CM

## 2021-06-22 DIAGNOSIS — R7989 Other specified abnormal findings of blood chemistry: Secondary | ICD-10-CM | POA: Diagnosis not present

## 2021-06-22 DIAGNOSIS — O99210 Obesity complicating pregnancy, unspecified trimester: Secondary | ICD-10-CM

## 2021-06-22 MED ORDER — PRENATAL MULTIVITAMIN CH: 1.0000 | ORAL_TABLET | Freq: Every day | ORAL | 0 refills | Status: DC

## 2021-06-22 NOTE — Progress Notes (Signed)
   PRENATAL VISIT NOTE  Subjective:  Cheryl Vasquez is a 40 y.o. [phone] at [redacted]w[redacted]d being seen today for ongoing prenatal care.  She is currently monitored for the following issues for this high-risk pregnancy and has Supervision of high risk pregnancy, antepartum; Obesity affecting pregnancy, antepartum; Advanced maternal age in multigravida; and Abnormal thyroid blood test on their problem list.  Patient reports  varicose veins .  Contractions: Not present. Vag. Bleeding: None.  Movement: Present. Denies leaking of fluid/ROM.   The following portions of the patient's history were reviewed and updated as appropriate: allergies, current medications, past family history, past medical history, past social history, past surgical history and problem list. Problem list updated.  Objective:   Vitals:   06/22/21 0910  BP: 106/62  Pulse: 82  Temp: (!) 82 F (27.8 C)  Weight: 177 lb 3.2 oz (80.4 kg)    Fetal Status: Fetal Heart Rate (bpm): 152 Fundal Height: 20 cm Movement: Present     General:  Alert, oriented and cooperative. Patient is in no acute distress.  Skin: Skin is warm and dry. No rash noted.   Cardiovascular: Normal heart rate noted  Respiratory: Normal respiratory effort, no problems with respiration noted  Abdomen: Soft, gravid, appropriate for gestational age.  Pain/Pressure: Absent     Pelvic: Cervical exam deferred        Extremities: Normal range of motion.  Edema: None  Mental Status: Normal mood and affect. Normal behavior. Normal judgment and thought content.   Assessment and Plan:  Pregnancy: [id number] at [redacted]w[redacted]d  1. Supervision of high risk pregnancy, antepartum Encouraged support stockings for varicose vein discomfort. Enc po hydration in light of very hot weather. Pt desired COVID vaccine booster - completed initial series 03/2020, printed vaccine record for pt and provided vaccine clinic info to pt. - Prenatal Vit-Fe Fumarate-FA (PRENATAL MULTIVITAMIN) TABS  tablet; Take 1 tablet by mouth daily at 12 noon.  Dispense: 100 tablet; Refill: 0  2. Abnormal thyroid blood test Low TSH and elevated T3 on 05/25/21 labs. Repeat bloodwork today as planned. Pt denies sx. - TSH + free T4 - T3  3. Antepartum multigravida of advanced maternal age Taking low dose aspirin. Plan weekly NST at 36 wk, with del by EDD.  4. Obesity affecting pregnancy, antepartum Taking low dose aspirin. Appropriate weight gain this far. Plan fetal growth [place] at 32 wk.   Preterm labor symptoms and general obstetric precautions including but not limited to vaginal bleeding, contractions, leaking of fluid and fetal movement were reviewed in detail with the patient. Please refer to After Visit Summary for other counseling recommendations.  Return in about 4 weeks (around 07/20/2021) for Routine prenatal care.  No future appointments.  Landry Dyke, PA-C

## 2021-06-23 LAB — TSH+FREE T4
Free T4: 0.92 ng/dL (ref 0.82–1.77)
TSH: 0.663 u[IU]/mL (ref 0.450–4.500)

## 2021-06-23 LAB — T3: T3, Total: 253 ng/dL — ABNORMAL HIGH (ref 71–180)

## 2021-07-26 ENCOUNTER — Other Ambulatory Visit: Payer: Self-pay

## 2021-07-26 ENCOUNTER — Encounter: Payer: Self-pay | Admitting: Advanced Practice Midwife

## 2021-07-26 ENCOUNTER — Ambulatory Visit: Payer: Self-pay | Admitting: Advanced Practice Midwife

## 2021-07-26 VITALS — BP 103/59 | HR 78 | Temp 97.3°F | Wt 178.8 lb

## 2021-07-26 DIAGNOSIS — R87610 Atypical squamous cells of undetermined significance on cytologic smear of cervix (ASC-US): Secondary | ICD-10-CM

## 2021-07-26 DIAGNOSIS — F32A Depression, unspecified: Secondary | ICD-10-CM | POA: Insufficient documentation

## 2021-07-26 DIAGNOSIS — O0990 Supervision of high risk pregnancy, unspecified, unspecified trimester: Secondary | ICD-10-CM

## 2021-07-26 DIAGNOSIS — O99212 Obesity complicating pregnancy, second trimester: Secondary | ICD-10-CM

## 2021-07-26 DIAGNOSIS — O09529 Supervision of elderly multigravida, unspecified trimester: Secondary | ICD-10-CM

## 2021-07-26 DIAGNOSIS — O99342 Other mental disorders complicating pregnancy, second trimester: Secondary | ICD-10-CM

## 2021-07-26 DIAGNOSIS — O99210 Obesity complicating pregnancy, unspecified trimester: Secondary | ICD-10-CM

## 2021-07-26 DIAGNOSIS — O0992 Supervision of high risk pregnancy, unspecified, second trimester: Secondary | ICD-10-CM

## 2021-07-26 DIAGNOSIS — O99340 Other mental disorders complicating pregnancy, unspecified trimester: Secondary | ICD-10-CM

## 2021-07-26 DIAGNOSIS — R87619 Unspecified abnormal cytological findings in specimens from cervix uteri: Secondary | ICD-10-CM

## 2021-07-26 HISTORY — DX: Unspecified abnormal cytological findings in specimens from cervix uteri: R87.619

## 2021-07-26 HISTORY — DX: Other mental disorders complicating pregnancy, unspecified trimester: O99.340

## 2021-07-26 MED ORDER — PRENATAL MULTIVITAMIN CH: 1.0000 | ORAL_TABLET | Freq: Every day | ORAL | 0 refills | Status: DC

## 2021-07-26 NOTE — Progress Notes (Signed)
PRENATAL VISIT NOTE  Subjective:  Cheryl Vasquez is a 40 y.o. [phone] at [redacted]w[redacted]d being seen today for ongoing prenatal care.  She is currently monitored for the following issues for this high-risk pregnancy and has Supervision of high risk pregnancy, antepartum; Obesity affecting pregnancy, antepartum; Advanced maternal age in multigravida 40 yo; and Abnormal thyroid blood test on their problem list.  Patient reports no complaints.  Contractions: Not present. Vag. Bleeding: None.  Movement: Present. Denies leaking of fluid/ROM.   The following portions of the patient's history were reviewed and updated as appropriate: allergies, current medications, past family history, past medical history, past social history, past surgical history and problem list. Problem list updated.  Objective:   Vitals:   07/26/21 0923  BP: (!) 103/59  Pulse: 78  Temp: (!) 97.3 F (36.3 C)  Weight: 178 lb 12.8 oz (81.1 kg)    Fetal Status: Fetal Heart Rate (bpm): 160 Fundal Height: 26 cm Movement: Present     General:  Alert, oriented and cooperative. Patient is in no acute distress.  Skin: Skin is warm and dry. No rash noted.   Cardiovascular: Normal heart rate noted  Respiratory: Normal respiratory effort, no problems with respiration noted  Abdomen: Soft, gravid, appropriate for gestational age.  Pain/Pressure: Absent     Pelvic: Cervical exam deferred        Extremities: Normal range of motion.  Edema: None  Mental Status: Normal mood and affect. Normal behavior. Normal judgment and thought content.   Assessment and Plan:  Pregnancy: G5P3013 at [redacted]w[redacted]d  1. Supervision of high risk pregnancy, antepartum Pt states she has been stressed and crying because her rent was increased by $550/mo without notice and she stopped working and can't afford rent now and can't move all her belongings by herself. She went to [facility] 2 wks ago and was given rx for Lexapro 10 mg but hasn't  begun because wants to know if it is safe. Living with her 12 yo daughter who is also anxious. FOB not helping her because he is questioning paternity. PHQ-9=2 today.  States she has seen Kathreen Cosier in the past and agrees to schedule another apt with her. Referral done. Also requesting Legal Aid #--given. Provider asked RN to call Gayland Curry for possible assistance.  - Prenatal Vit-Fe Fumarate-FA (PRENATAL MULTIVITAMIN) TABS tablet; Take 1 tablet by mouth daily at 12 noon. (Patient not taking: Reported on 07/26/2021)  Dispense: 100 tablet; Refill: 0  2. Depression/anxiety affecting pregnancy Counseled pt re: safety of Lexapro during pregnancy.  PHQ-9=2 today. Note written to landlord - Ambulatory referral to Behavioral Health  3. Obesity affecting pregnancy, antepartum 4 lb 12.8 oz (2.177 kg) Visited a friend so hasn't been taking ASA 81 mg or vits past 1.5 wks--to resume today  4. Atypical squamous cells of undetermined significance on cytologic smear of cervix (ASC-US) ASCUS HPV - 09/23/20: needs cotest 09/2023  5. Antepartum multigravida of advanced maternal age Needs 32 wk growth u/s Needs weekly NST/AFI beginning 36 wks  - Prenatal Vit-Fe Fumarate-FA (PRENATAL MULTIVITAMIN) TABS tablet; Take 1 tablet by mouth daily at 12 noon. (Patient not taking: Reported on 07/26/2021)  Dispense: 100 tablet; Refill: 0   Preterm labor symptoms and general obstetric precautions including but not limited to vaginal bleeding, contractions, leaking of fluid and fetal movement were reviewed in detail with the patient. Please refer to After Visit Summary for other counseling recommendations.  No follow-ups on file.  No future appointments.  Herbie Saxon, CNM

## 2021-07-26 NOTE — Progress Notes (Signed)
Patient here at 25w 0d for [facility] RV.   [facility] high risk forms filled and PHQ9.  Preterm labor handout given.   Prenatal vitamins given today, she states she left in a another town while visiting a friend and she left also her ASA 81mg  there as well. Informed her to pick up ASA 81mg  as soon as she can.  Patient states she also had a visit at her PCP at [facility] 2 weeks ago regarding increase anxiety and depression she has been having the last month due to her landlord increasing $500 in rent with very little notice. States she was unable to make payment and has been having to look for a place to stay and move on her own. Has been crying and worried about housing situation. She has found a place but needs more time to move her belongings. Provider, , CNM made patient a letter to give her landlord requesting a few more days to move out. Patient states this will help her a lot in the moving process.   She was prescribed Lexapro 10mg  2 weeks ago and has not started taking it yet, she was unsure if it was safe to take during pregancy. Provider, [facility], CNM made aware.   Per provider, safe to take during pregnancy. Patient made aware.   F/u appointment made to return in 3 weeks for 28 week labs on 08/18/21 at 0840. Appointment reminder card given.   , RN

## 2021-08-17 ENCOUNTER — Telehealth: Payer: Self-pay

## 2021-08-17 NOTE — Telephone Encounter (Signed)
Patient states she is not feeling well, complaining of head cold, cough, hoarseness in her voice, and sore throat for past 4 days (since 8/22). Patient did two covid at-home test two days ago and have shown up negative. She states her daughter is also not feeling well with similar symptoms. Patient encourage to stay home and rest, hydrate and take medications from the "Safe medications in pregnancy List" according to her symptoms. Her appointment for 8/26 was rescheduled at patients earliest conveyance next week, patient prefers Friday appointments, appointment rescheduled for 08/25/21 at 0820.   Floy Sabina, RN

## 2021-08-18 ENCOUNTER — Ambulatory Visit: Payer: Self-pay

## 2021-08-24 ENCOUNTER — Other Ambulatory Visit: Payer: Self-pay

## 2021-08-24 ENCOUNTER — Ambulatory Visit: Payer: Self-pay | Admitting: Licensed Clinical Social Worker

## 2021-08-24 ENCOUNTER — Encounter: Payer: Self-pay | Admitting: Physician Assistant

## 2021-08-24 ENCOUNTER — Ambulatory Visit: Payer: Self-pay | Admitting: Physician Assistant

## 2021-08-24 VITALS — BP 103/68 | HR 76 | Temp 98.0°F | Wt 179.2 lb

## 2021-08-24 DIAGNOSIS — O99340 Other mental disorders complicating pregnancy, unspecified trimester: Secondary | ICD-10-CM

## 2021-08-24 DIAGNOSIS — O99213 Obesity complicating pregnancy, third trimester: Secondary | ICD-10-CM

## 2021-08-24 DIAGNOSIS — O99343 Other mental disorders complicating pregnancy, third trimester: Secondary | ICD-10-CM

## 2021-08-24 DIAGNOSIS — F32A Depression, unspecified: Secondary | ICD-10-CM

## 2021-08-24 DIAGNOSIS — O0990 Supervision of high risk pregnancy, unspecified, unspecified trimester: Secondary | ICD-10-CM

## 2021-08-24 DIAGNOSIS — O0993 Supervision of high risk pregnancy, unspecified, third trimester: Secondary | ICD-10-CM

## 2021-08-24 DIAGNOSIS — O09523 Supervision of elderly multigravida, third trimester: Secondary | ICD-10-CM

## 2021-08-24 DIAGNOSIS — O09529 Supervision of elderly multigravida, unspecified trimester: Secondary | ICD-10-CM

## 2021-08-24 DIAGNOSIS — F4322 Adjustment disorder with anxiety: Secondary | ICD-10-CM

## 2021-08-24 DIAGNOSIS — O99210 Obesity complicating pregnancy, unspecified trimester: Secondary | ICD-10-CM

## 2021-08-24 LAB — HEMOGLOBIN, FINGERSTICK: Hemoglobin: 11.7 g/dL (ref 11.1–15.9)

## 2021-08-24 NOTE — Progress Notes (Signed)
Patient here for MH RV at 29 1/7. 28 week labs today, 1 hour gtt and Tdap. [facility] pager card given, and did not get her covid booster on 08/18/2021 as planned. She states she had symptoms and did not go, then did a covid test which was negative. Plans to go in the future.Burt Knack, RN

## 2021-08-24 NOTE — Progress Notes (Signed)
Hgb result reviewed, no treatment indicated. Tdap given, tolerated well, VIS given. Patient states she has new phone number and will call [facility] with number.Burt Knack, RN
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
## 2021-08-24 NOTE — Progress Notes (Signed)
   PRENATAL VISIT NOTE  Subjective:  Cheryl Vasquez is a 40 y.o. [phone] at [redacted]w[redacted]d being seen today for ongoing prenatal care.  She is currently monitored for the following issues for this high-risk pregnancy and has Supervision of high risk pregnancy, antepartum; Obesity affecting pregnancy, antepartum; Advanced maternal age in multigravida 40 yo; Abnormal thyroid blood test; Depression/anxiety affecting pregnancy; and Abnormal Pap smear of cervix ASCUS HPV - 09/23/20 on their problem list.  Patient reports  occ foot swelling (mostly after prolonged standing), vulvar and leg varicosities, and nighttime nasal congestion .  Contractions: Not present. Vag. Bleeding: None.  Movement: Present. Denies leaking of fluid/ROM.   The following portions of the patient's history were reviewed and updated as appropriate: allergies, current medications, past family history, past medical history, past social history, past surgical history and problem list. Problem list updated.  Objective:   Vitals:   08/24/21 1341  BP: 103/68  Pulse: 76  Temp: 98 F (36.7 C)  Weight: 179 lb 3.2 oz (81.3 kg)    Fetal Status: Fetal Heart Rate (bpm): 148 Fundal Height: 30 cm Movement: Present     General:  Alert, oriented and cooperative. Patient is in no acute distress.  Skin: Skin is warm and dry. No rash noted.   Cardiovascular: Normal heart rate noted  Respiratory: Normal respiratory effort, no problems with respiration noted  Abdomen: Soft, gravid, appropriate for gestational age.  Pain/Pressure: Absent     Pelvic: Cervical exam deferred        Extremities: Normal range of motion.  Edema: Trace  Mental Status: Normal mood and affect. Normal behavior. Normal judgment and thought content.   Assessment and Plan:  Pregnancy: [id number] at [redacted]w[redacted]d  1. Supervision of high risk pregnancy, antepartum Routine 28-wk labs today.  2. Obesity affecting pregnancy, antepartum Continue aspirin. 5 lb TWG, with 1 lb  interim wt gain. Will continue to monitor. Pt reports good appetite/po intake.  3. Depression/anxiety affecting pregnancy Appropriate affect today, has appt with LCSW today.  4. Antepartum multigravida of advanced maternal age Continue aspirin. Sched 32 wk growth [place], plan to start weekly NSTs at 36 wk.   Preterm labor symptoms and general obstetric precautions including but not limited to vaginal bleeding, contractions, leaking of fluid and fetal movement were reviewed in detail with the patient. Please refer to After Visit Summary for other counseling recommendations.  Return in about 2 weeks (around 09/07/2021) for Routine prenatal care.  Future Appointments  Date Time Provider Department Center  08/24/2021  4:00 PM Kathreen Cosier, LCSW [facility] None  09/07/2021  3:00 PM [facility] PROVIDER [facility] None    Landry Dyke, PA-C

## 2021-08-25 ENCOUNTER — Ambulatory Visit: Payer: Self-pay

## 2021-08-25 ENCOUNTER — Telehealth: Payer: Self-pay

## 2021-08-25 NOTE — Telephone Encounter (Signed)
TC to patient to get her new telephone number. Old number no longer working per patient at her appt yesterday. She stated at her appt that she would call with her new number. Unable to contact patient with phone number in chart. TC to patient emergency contact, Nellie, a friend. Per Nellie, she will tell patient to call [facility] with her new phone number as soon as she sees patient. Nellie encouraged to let patient know asap and it's very important. Interpreter, Juliene Pina.Marland KitchenMarland KitchenBurt Knack, RN

## 2021-08-25 NOTE — Telephone Encounter (Signed)
Patient called back to say she won't have a new phone number until Monday. Patient informed that [facility] is closed on Monday. Patient states she will call on Tuesday 08/29/21 with her new number. Patient informed that she needs an U/S and we can't fax referral without her phone number and [facility] can't call her to schedule appointment without her phone number and that she needs to call early on Tuesday so we can fax referral. Patient states understanding and says she will call Tues. Interpreter V. Olmedo.Burt Knack, RN

## 2021-08-26 LAB — RPR: RPR Ser Ql: NONREACTIVE

## 2021-08-26 LAB — HIV-1/HIV-2 QUALITATIVE RNA
HIV-1 RNA, Qualitative: NONREACTIVE
HIV-2 RNA, Qualitative: NONREACTIVE

## 2021-08-26 LAB — GLUCOSE, 1 HOUR GESTATIONAL: Gestational Diabetes Screen: 112 mg/dL (ref 65–139)

## 2021-08-29 NOTE — Telephone Encounter (Signed)
As of 1 pm, client has not yet notified [facility] of new phone number. Call to client's cell number of record with Salli Real and per recorded message ,number is not accepting calls at this time. Call to emergency contact and left message requesting assistance contacting client to call [facility] with new phone number so [place] referral can be sent to [facility]. Number to call provided. Jossie Ng, RN

## 2021-08-31 NOTE — Telephone Encounter (Signed)
Patient now scheduled for [facility] RV on 09/07/2021. Please ask at her appointment for new phone number.Burt Knack, RN

## 2021-09-07 ENCOUNTER — Ambulatory Visit: Payer: Self-pay | Admitting: Physician Assistant

## 2021-09-07 ENCOUNTER — Other Ambulatory Visit: Payer: Self-pay

## 2021-09-07 VITALS — BP 96/66 | HR 83 | Temp 97.9°F | Wt 180.8 lb

## 2021-09-07 DIAGNOSIS — O09529 Supervision of elderly multigravida, unspecified trimester: Secondary | ICD-10-CM

## 2021-09-07 DIAGNOSIS — O99340 Other mental disorders complicating pregnancy, unspecified trimester: Secondary | ICD-10-CM

## 2021-09-07 DIAGNOSIS — O0993 Supervision of high risk pregnancy, unspecified, third trimester: Secondary | ICD-10-CM

## 2021-09-07 DIAGNOSIS — O99213 Obesity complicating pregnancy, third trimester: Secondary | ICD-10-CM

## 2021-09-07 DIAGNOSIS — O99210 Obesity complicating pregnancy, unspecified trimester: Secondary | ICD-10-CM

## 2021-09-07 DIAGNOSIS — O09523 Supervision of elderly multigravida, third trimester: Secondary | ICD-10-CM

## 2021-09-07 DIAGNOSIS — O0990 Supervision of high risk pregnancy, unspecified, unspecified trimester: Secondary | ICD-10-CM

## 2021-09-07 DIAGNOSIS — O99343 Other mental disorders complicating pregnancy, third trimester: Secondary | ICD-10-CM

## 2021-09-07 DIAGNOSIS — F32A Depression, unspecified: Secondary | ICD-10-CM

## 2021-09-07 NOTE — Progress Notes (Addendum)
Kick counts reviewed with patient and cards given. TC to [facility] to schedule U/S while patient in office. Earliest U/S available is 09/21/2021 at 4pm at [facility]. Provider aware and patient states she will go to that appointment. Patient has moved to [facility] and wishes to continue care here at [facility]. Address and new phone number updated in chart. New phone number belongs to her daughter who lives next door to patient, patient does not have another phone. Patient states she will call V. Olmedo tomorrow with her new emergency contact name and number. Burt Knack, RN

## 2021-09-07 NOTE — Progress Notes (Signed)
[facility]  PRENATAL VISIT NOTE  Subjective:  Cheryl Vasquez is a 40 y.o. [phone] at [redacted]w[redacted]d being seen today for ongoing prenatal care.  She is currently monitored for the following issues for this high-risk pregnancy and has Supervision of high risk pregnancy, antepartum; Obesity affecting pregnancy, antepartum; Advanced maternal age in multigravida 40 yo; Abnormal thyroid blood test; Depression/anxiety affecting pregnancy; and Abnormal Pap smear of cervix ASCUS HPV - 09/23/20 on their problem list.  Patient reports heartburn.  Contractions: Not present. Vag. Bleeding: None.  Movement: Present. Notes her stuffy nose and trouble sleeping are much improved after moving from home where she thought there was mold to another home. Mood has been much happier. Of note, only took Lexapro 10mg  qd for about 2 weeks due to nausea, then discontinued. Denies leaking of fluid/ROM.   The following portions of the patient's history were reviewed and updated as appropriate: allergies, current medications, past family history, past medical history, past social history, past surgical history and problem list. Problem list updated.  Objective:   Vitals:   09/07/21 1516  BP: 96/66  Pulse: 83  Temp: 97.9 F (36.6 C)  Weight: 180 lb 12.8 oz (82 kg)    Fetal Status: Fetal Heart Rate (bpm): 132 Fundal Height: 32 cm Movement: Present     General:  Alert, oriented and cooperative. Patient is in no acute distress.  Skin: Skin is warm and dry. No rash noted.   Cardiovascular: Normal heart rate noted  Respiratory: Normal respiratory effort, no problems with respiration noted  Abdomen: Soft, gravid, appropriate for gestational age.  Pain/Pressure: Absent     Pelvic: Cervical exam deferred        Extremities: Normal range of motion.  Edema: Trace  Mental Status: Normal mood and affect. Normal behavior. Normal judgment and thought content.   Assessment and  Plan:  Pregnancy: 09/09/21 at [redacted]w[redacted]d  1. Supervision of high risk pregnancy, antepartum Taking prenatal vitamin. Continue regular visit schedule.  2. Antepartum multigravida of advanced maternal age Schedule 32 wk fetal growth [redacted]w[redacted]d, plan weekly NST/AFI at 36 wk and IOL if not del by EDC/40wk. Continue aspirin til 36 wk.  3. Obesity affecting pregnancy, antepartum Continue aspirin til 36 wk.  4. Depression/anxiety affecting pregnancy Pt reports sx are improved and attributes this to change in home. Continues to meet with LCSW for counseling, next appt 9/27. Agrees to restart Lexapro 10mg  1/2 tablet qd to see if can have benefit without SE. Has adequate supply at home (15 tabs from original 30 tab supply). Reassess at RV.   Preterm labor symptoms and general obstetric precautions including but not limited to vaginal bleeding, contractions, leaking of fluid and fetal movement were reviewed in detail with the patient.  Due to language barrier, an interpreter  10/27) was present during the provider portion of the visit with this patient.  Please refer to After Visit Summary for other counseling recommendations.  Return in about 2 weeks (around 09/21/2021) for Routine prenatal care.  Future Appointments  Date Time Provider Department Center  09/21/2021  4:00 PM 09/23/2021, LCSW [facility] None    09/23/2021, Kathreen Cosier

## 2021-09-08 ENCOUNTER — Ambulatory Visit: Payer: Self-pay

## 2021-09-21 ENCOUNTER — Ambulatory Visit: Payer: Self-pay | Admitting: Licensed Clinical Social Worker

## 2021-09-22 ENCOUNTER — Ambulatory Visit: Payer: Self-pay

## 2021-09-22 ENCOUNTER — Telehealth: Payer: Self-pay
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
## 2021-09-25 NOTE — Telephone Encounter (Signed)
TC to patient to reschedule missed appointment. Unable to LM. Interpreter M. [place].Burt Knack, RN

## 2021-09-26 NOTE — Telephone Encounter (Signed)
Call to client to reschedule missed [facility] RV appt and per recorded message, voicemail is not set up. No emergency contact in Epic. Pink sticky created regarding need for voicemail set up and contact number. [employer] ID # [id number] interpreted during call. Jossie Ng, RN

## 2021-09-28 ENCOUNTER — Encounter: Payer: Self-pay | Admitting: Advanced Practice Midwife

## 2021-09-29 NOTE — Telephone Encounter (Signed)
Call to patient regarding missed [facility] RV appt. No answer and voicemail is not set up. There is no emergency contact in patient's chart.   Floy Sabina, RN

## 2021-09-29 NOTE — Telephone Encounter (Signed)
Patient's [facility] Chart has an alternative number for patient: [phone] and also has an emergency contact, Lynden Oxford, phone number [phone]. Will attempt to call these numbers.   Called number [phone] and that is not a working number.   Called [phone] and that number did not belong to [facility], an english speaking female answered stating he did not know who Theone Murdoch was.   Floy Sabina, RN

## 2021-10-03 NOTE — Telephone Encounter (Signed)
Call to client to reschedule missed [facility] RV appt with Salli Real. Client immediately returned call to [place], but she was unable to pick up call as still on a three-way call. She immediately called client back, but she did not pick up. Message left to reschedule [facility] RV appt and number provided. Jossie Ng, RN

## 2021-10-10 NOTE — Telephone Encounter (Signed)
Patient has appt 10/11/21.Marland KitchenBurt Knack, RN

## 2021-10-11 ENCOUNTER — Ambulatory Visit: Payer: Self-pay | Admitting: Advanced Practice Midwife

## 2021-10-11 ENCOUNTER — Other Ambulatory Visit: Payer: Self-pay

## 2021-10-11 VITALS — BP 114/74 | Temp 98.0°F | Wt 180.8 lb

## 2021-10-11 DIAGNOSIS — O0990 Supervision of high risk pregnancy, unspecified, unspecified trimester: Secondary | ICD-10-CM

## 2021-10-11 DIAGNOSIS — O09529 Supervision of elderly multigravida, unspecified trimester: Secondary | ICD-10-CM

## 2021-10-11 DIAGNOSIS — O99340 Other mental disorders complicating pregnancy, unspecified trimester: Secondary | ICD-10-CM

## 2021-10-11 DIAGNOSIS — F32A Depression, unspecified: Secondary | ICD-10-CM

## 2021-10-11 DIAGNOSIS — R7989 Other specified abnormal findings of blood chemistry: Secondary | ICD-10-CM

## 2021-10-11 DIAGNOSIS — O99343 Other mental disorders complicating pregnancy, third trimester: Secondary | ICD-10-CM

## 2021-10-11 DIAGNOSIS — O99213 Obesity complicating pregnancy, third trimester: Secondary | ICD-10-CM

## 2021-10-11 DIAGNOSIS — I8393 Asymptomatic varicose veins of bilateral lower extremities: Secondary | ICD-10-CM

## 2021-10-11 DIAGNOSIS — O99210 Obesity complicating pregnancy, unspecified trimester: Secondary | ICD-10-CM

## 2021-10-11 DIAGNOSIS — O09523 Supervision of elderly multigravida, third trimester: Secondary | ICD-10-CM

## 2021-10-11 DIAGNOSIS — Z91199 Patient's noncompliance with other medical treatment and regimen due to unspecified reason: Secondary | ICD-10-CM | POA: Insufficient documentation

## 2021-10-11 DIAGNOSIS — O0993 Supervision of high risk pregnancy, unspecified, third trimester: Secondary | ICD-10-CM

## 2021-10-11 DIAGNOSIS — I8390 Asymptomatic varicose veins of unspecified lower extremity: Secondary | ICD-10-CM | POA: Insufficient documentation

## 2021-10-11 MED ORDER — PRENATAL MULTIVITAMIN CH: 1.0000 | ORAL_TABLET | Freq: Every day | ORAL | 0 refills | Status: DC
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
## 2021-10-11 NOTE — Progress Notes (Signed)
Patient here at 36w for MH RV. Patient has not received care in 5 weeks due to not wanting to follow-up and no phone number. Patient now has number updated.   PNV given to patient.   Provider to swab patient for 36 week labs.   Marchelle Folks, LCSW card given to patient.   Patient to have weekly NST per E. Sciora, CNM.   Patient desires flu vaccine next MH RV.   Floy Sabina, RN

## 2021-10-12 LAB — TSH+FREE T4
Free T4: 0.91 ng/dL (ref 0.82–1.77)
TSH: 1.11 u[IU]/mL (ref 0.450–4.500)

## 2021-10-12 LAB — T3 UPTAKE: T3 Uptake Ratio: 13 % — ABNORMAL LOW (ref 24–39)

## 2021-10-14 LAB — CHLAMYDIA/GC NAA, CONFIRMATION
Chlamydia trachomatis, NAA: NEGATIVE
Neisseria gonorrhoeae, NAA: NEGATIVE

## 2021-10-15 LAB — CULTURE, BETA STREP (GROUP B ONLY): Strep Gp B Culture: NEGATIVE

## 2021-10-19 ENCOUNTER — Ambulatory Visit: Payer: Self-pay | Admitting: Physician Assistant

## 2021-10-19 ENCOUNTER — Other Ambulatory Visit: Payer: Self-pay

## 2021-10-19 ENCOUNTER — Encounter: Payer: Self-pay | Admitting: Physician Assistant

## 2021-10-19 VITALS — BP 117/78 | HR 77 | Temp 98.0°F | Wt 180.4 lb

## 2021-10-19 DIAGNOSIS — R7989 Other specified abnormal findings of blood chemistry: Secondary | ICD-10-CM

## 2021-10-19 DIAGNOSIS — O99210 Obesity complicating pregnancy, unspecified trimester: Secondary | ICD-10-CM

## 2021-10-19 DIAGNOSIS — F32A Depression, unspecified: Secondary | ICD-10-CM

## 2021-10-19 DIAGNOSIS — O09529 Supervision of elderly multigravida, unspecified trimester: Secondary | ICD-10-CM

## 2021-10-19 DIAGNOSIS — O99343 Other mental disorders complicating pregnancy, third trimester: Secondary | ICD-10-CM

## 2021-10-19 DIAGNOSIS — O99340 Other mental disorders complicating pregnancy, unspecified trimester: Secondary | ICD-10-CM

## 2021-10-19 DIAGNOSIS — O0990 Supervision of high risk pregnancy, unspecified, unspecified trimester: Secondary | ICD-10-CM

## 2021-10-19 DIAGNOSIS — O0993 Supervision of high risk pregnancy, unspecified, third trimester: Secondary | ICD-10-CM

## 2021-10-19 DIAGNOSIS — O99213 Obesity complicating pregnancy, third trimester: Secondary | ICD-10-CM

## 2021-10-19 DIAGNOSIS — Z23 Encounter for immunization: Secondary | ICD-10-CM

## 2021-10-19 DIAGNOSIS — O09523 Supervision of elderly multigravida, third trimester: Secondary | ICD-10-CM

## 2021-10-19 MED ORDER — PRENATAL VITAMIN 27-0.8 MG PO TABS: 1.0000 | ORAL_TABLET | Freq: Every day | ORAL | 0 refills | Status: DC

## 2021-10-19 NOTE — Progress Notes (Signed)
See other encounter from today..Brenley Priore Brewer-Jensen, RN  °

## 2021-10-19 NOTE — Progress Notes (Signed)
Flu vaccine given, tolerated well, VIS given, NCIR in accordian folder. NST done today, reviewed by A. Streilein, PA and patient scheduled for MH RV with NST next week. Patient given more PNV, states she misplaced her others.Burt Knack, RN

## 2021-10-19 NOTE — Addendum Note (Signed)
Addended by: Burt Knack on: 10/19/2021 05:34 PM   Modules accepted: Orders

## 2021-10-19 NOTE — Progress Notes (Signed)
Patient here for MH RV at 37 1/7. Desires flu vaccine today. NST today.Burt Knack, RN

## 2021-10-19 NOTE — Progress Notes (Signed)
[facility]  PRENATAL VISIT NOTE  Subjective:  Cheryl Vasquez is a 40 y.o. [phone] at [redacted]w[redacted]d being seen today for ongoing prenatal care.  She is currently monitored for the following issues for this high-risk pregnancy and has Supervision of high risk pregnancy, antepartum; Obesity affecting pregnancy, antepartum; Advanced maternal age in multigravida 40 yo; Abnormal thyroid blood test; Depression/anxiety affecting pregnancy; Abnormal Pap smear of cervix ASCUS HPV - 09/23/20; Noncompliance; no prenatal care x 5 wks (09/07/21-19/19/22); and Varicosities of legs on their problem list.  Patient reports no complaints.  Contractions: Irregular. Vag. Bleeding: None.  Movement: Present. States she is having 2 Braxton-Hicks type contractions a day. Denies leaking of fluid/ROM.   The following portions of the patient's history were reviewed and updated as appropriate: allergies, current medications, past family history, past medical history, past social history, past surgical history and problem list. Problem list updated.  Objective:   Vitals:   10/19/21 1534  BP: 117/78  Pulse: 77  Temp: 98 F (36.7 C)  Weight: 180 lb 6.4 oz (81.8 kg)    Fetal Status: Fetal Heart Rate (bpm): 136 Fundal Height: 37 cm Movement: Present  Presentation: Vertex  General:  Alert, oriented and cooperative. Patient is in no acute distress.  Skin: Skin is warm and dry. No rash noted.   Cardiovascular: Normal heart rate noted  Respiratory: Normal respiratory effort, no problems with respiration noted  Abdomen: Soft, gravid, appropriate for gestational age.  Pain/Pressure: Absent     Pelvic: Cervical exam deferred        Extremities: Normal range of motion.  Edema: None  Mental Status: Normal mood and affect. Normal behavior. Normal judgment and thought content.   Assessment and Plan:  Pregnancy: [id number] at [redacted]w[redacted]d  1. Supervision of high risk pregnancy,  antepartum Doing well. Misplaced prenatal vitamin refill, so asks for another bottle today. This is provided.  2. Antepartum multigravida of advanced maternal age NST today is done and interpreted as reassuring for gestational age. Continue weekly.  3. Obesity affecting pregnancy, antepartum 6.6 lb TWG for preg.  4. Depression/anxiety affecting pregnancy Has not yet connected with [facility] LCSW, but would like to do so. Will reinforce referral.  5. Abnormal thyroid blood test Reviewed normal labs done at last visit with pt. No f/u needed.   Term labor symptoms and general obstetric precautions including but not limited to vaginal bleeding, contractions, leaking of fluid and fetal movement were reviewed in detail with the patient.   Due to language barrier, an interpreter Leia Alf) was present during provider portion of the visit with this patient.  Please refer to After Visit Summary for other counseling recommendations.  Return in about 1 week (around 10/26/2021) for Routine prenatal care, NST.  Future Appointments  Date Time Provider Department Center  10/26/2021  3:20 PM AC-MH PROVIDER AC-MAT None  10/26/2021  3:40 PM AC-[facility] PROVIDER AC-[facility] None    Landry Dyke, PA-C

## 2021-10-26 ENCOUNTER — Other Ambulatory Visit: Payer: Self-pay

## 2021-10-26 ENCOUNTER — Ambulatory Visit: Payer: Self-pay | Admitting: Physician Assistant

## 2021-10-26 ENCOUNTER — Encounter: Payer: Self-pay | Admitting: Physician Assistant

## 2021-10-26 VITALS — BP 116/76 | HR 91 | Temp 98.8°F | Wt 182.4 lb

## 2021-10-26 DIAGNOSIS — O99343 Other mental disorders complicating pregnancy, third trimester: Secondary | ICD-10-CM

## 2021-10-26 DIAGNOSIS — O09529 Supervision of elderly multigravida, unspecified trimester: Secondary | ICD-10-CM

## 2021-10-26 DIAGNOSIS — O99210 Obesity complicating pregnancy, unspecified trimester: Secondary | ICD-10-CM

## 2021-10-26 DIAGNOSIS — O99213 Obesity complicating pregnancy, third trimester: Secondary | ICD-10-CM

## 2021-10-26 DIAGNOSIS — O0993 Supervision of high risk pregnancy, unspecified, third trimester: Secondary | ICD-10-CM

## 2021-10-26 DIAGNOSIS — O0990 Supervision of high risk pregnancy, unspecified, unspecified trimester: Secondary | ICD-10-CM

## 2021-10-26 DIAGNOSIS — O09523 Supervision of elderly multigravida, third trimester: Secondary | ICD-10-CM

## 2021-10-26 DIAGNOSIS — O99340 Other mental disorders complicating pregnancy, unspecified trimester: Secondary | ICD-10-CM

## 2021-10-26 NOTE — Progress Notes (Signed)
Patient placed on NST machine at 1625. Initial FHR at 153.

## 2021-10-26 NOTE — Progress Notes (Signed)
NST monitoring initiated at 1625. Jossie Ng, RN [phone] NST monitoring discontinued following review of strip by Elveria Rising FNP-BC. 1 week MHC RV and NST appt scheduled for 11/02/2021 and reminder card given. Jossie Ng, RN

## 2021-10-26 NOTE — Progress Notes (Signed)
[facility]  PRENATAL VISIT NOTE  Subjective:  Cheryl Vasquez is a 40 y.o. [phone] at [redacted]w[redacted]d being seen today for ongoing prenatal care.  She is currently monitored for the following issues for this high-risk pregnancy and has Supervision of high risk pregnancy, antepartum; Obesity affecting pregnancy, antepartum; Advanced maternal age in multigravida 40 yo; Abnormal thyroid blood test; Depression/anxiety affecting pregnancy; Abnormal Pap smear of cervix ASCUS HPV - 09/23/20; Noncompliance; no prenatal care x 5 wks (09/07/21-19/19/22); and Varicosities of legs on their problem list.  Patient reports heartburn.  Contractions: Irregular. Vag. Bleeding: None.  Movement: Present. Taking Tums with some heartburn relief. Denies leaking of fluid/ROM.   The following portions of the patient's history were reviewed and updated as appropriate: allergies, current medications, past family history, past medical history, past social history, past surgical history and problem list. Problem list updated.  Objective:   Vitals:   10/26/21 1532  BP: 116/76  Pulse: 91  Temp: 98.8 F (37.1 C)  Weight: 182 lb 6.4 oz (82.7 kg)    Fetal Status: Fetal Heart Rate (bpm): 150 Fundal Height: 36 cm Movement: Present  Presentation: Vertex  General:  Alert, oriented and cooperative. Patient is in no acute distress.  Skin: Skin is warm and dry. No rash noted.   Cardiovascular: Normal heart rate noted  Respiratory: Normal respiratory effort, no problems with respiration noted  Abdomen: Soft, gravid, appropriate for gestational age.  Pain/Pressure: Absent     Pelvic: Cervical exam deferred        Extremities: Normal range of motion.  Edema: None  Mental Status: Normal mood and affect. Normal behavior. Normal judgment and thought content.   Assessment and Plan:  Pregnancy: [id number] at [redacted]w[redacted]d  1. Supervision of high risk pregnancy, antepartum Continue PNV.  2.  Antepartum multigravida of advanced maternal age Weekly NST today is reactive/reassuring for gestational age.  3. Obesity affecting pregnancy, antepartum TWG 8lb 6 oz with 2 lb interval wt gain.  4. Depression/anxiety affecting pregnancy States mood is good, following with counseling visits.   Term labor symptoms and general obstetric precautions including but not limited to vaginal bleeding, contractions, leaking of fluid and fetal movement were reviewed in detail with the patient. Please refer to After Visit Summary for other counseling recommendations.  Return in about 1 week (around 11/02/2021) for Routine prenatal care, NST.  Future Appointments  Date Time Provider Department Center  11/02/2021  2:40 PM AC-MH PROVIDER AC-MAT None    Landry Dyke, PA-C

## 2021-11-02 ENCOUNTER — Ambulatory Visit: Payer: Self-pay

## 2021-11-02 ENCOUNTER — Telehealth: Payer: Self-pay

## 2021-11-02 NOTE — Telephone Encounter (Signed)
Call to patient as she was a no-show for today's appointment.   Appt for MH RV and NST scheduled.    Per patient she did not come to visit as she went to [facility] for c/o of contractions and LOF.   Patient was discharge home as they told here it was still early labor/braxon hicks.   Per [facility] notes they had patient on NST monitor. They also did wet mount (negative).   Per provider notes at [facility] placed an order to request IOL on/around EDD given AMA age >32.   Floy Sabina, RN

## 2021-11-08 ENCOUNTER — Ambulatory Visit: Payer: Self-pay

## 2024-01-29 ENCOUNTER — Ambulatory Visit: Payer: Self-pay

## 2024-01-29 VITALS — BP 125/81 | HR 64 | Wt 172.6 lb

## 2024-01-29 DIAGNOSIS — Z3009 Encounter for other general counseling and advice on contraception: Secondary | ICD-10-CM

## 2024-01-29 DIAGNOSIS — Z3046 Encounter for surveillance of implantable subdermal contraceptive: Secondary | ICD-10-CM

## 2024-01-29 DIAGNOSIS — Z720 Tobacco use: Secondary | ICD-10-CM

## 2024-01-29 DIAGNOSIS — R87610 Atypical squamous cells of undetermined significance on cytologic smear of cervix (ASC-US): Secondary | ICD-10-CM

## 2024-01-29 LAB — HM HIV SCREENING LAB: HM HIV Screening: NEGATIVE

## 2024-01-29 LAB — WET PREP FOR TRICH, YEAST, CLUE
Trichomonas Exam: NEGATIVE
Yeast Exam: NEGATIVE

## 2024-01-29 LAB — HEMOGLOBIN, FINGERSTICK: Hemoglobin: 13 g/dL (ref 11.1–15.9)

## 2024-01-29 NOTE — Progress Notes (Signed)
 Patient is here for PE. FP packet given to patient and contents reviewed. Wet prep results reviewed with pt, no treatment required per standing order. Condoms declined. Austine Lefort, RN.

## 2024-01-29 NOTE — Progress Notes (Signed)
 [facility] [address] [place] [address] Main phone: [phone]  Family Planning Visit - Initial Visit  Subjective:  Cheryl Vasquez is a 43 y.o. SHF exsmoker  [id number]  (21, 19,6, 2)  being seen today for an initial annual visit and to discuss reproductive life planning.  The patient is currently using hormonal implant for pregnancy prevention. Patient does not want a pregnancy in the next year.   Patient reports they are looking for a method with the following characteristics:  High efficacy at preventing pregnancy  Patient has the following medical conditions: Patient Active Problem List   Diagnosis Date Noted   Noncompliance; no prenatal care x 5 wks (09/07/21-19/19/22) 10/11/2021   Varicosities of legs 10/11/2021   Depression/anxiety affecting pregnancy 07/26/2021   Abnormal Pap smear of cervix ASCUS HPV - 09/23/20 07/26/2021   Abnormal thyroid blood test 05/25/2021    Chief Complaint  Patient presents with   Annual Exam    Pt is here PE .    HPI Patient reports here for physical. Last PE 04/25/21. LMP 12/07/23. Last sex 09/2023 without condom; not with that partner anymore. Last cig 2 years 9 months ago. Last ETOH 2 years ago (4 beers). Working 40 hrs/wk and living with 3 of her children. Last dental exam 2023. Highest grade completed 12. Never had mammogram. Last pap 09/23/2020 ASCUS HPV neg.   Patient denies vaping, cigars, MJ  Review of Systems  All other systems reviewed and are negative.   Diabetes screening This patient is 43 y.o. with a BMI of Body mass index is 32.61 kg/m.SABRA  Is patient eligible for diabetes screening (age >35 and BMI >25)?  yes  Was Hgb A1c ordered? yes  STI screening Patient reports 1 of partners in last year.  Does this patient desire STI screening?  Yes  Hepatitis C screening Has patient been screened once for HCV in the past?  Yes  No results found  for: HCVAB  Does the patient meet criteria for HCV testing? No  (If yes-- Screen for HCV through [facility]) Criteria:  Since the last HCV result, does the patient have any of the following? - Current drug use - Have a partner with drug use - Has been incarcerated  Hepatitis B screening Does the patient meet criteria for HBV testing? No Criteria:  -Household, sexual or needle sharing contact with HBV -History of drug use -HIV positive -Those with known Hep C  Cervical Cancer Screening  Result Date Procedure Results Follow-ups  09/23/2020 IGP, Aptima HPV DIAGNOSIS:: Comment (A) HPV Aptima: Negative Specimen adequacy:: Comment Clinician Provided ICD10: Comment Performed by:: Comment Electronically signed by:: Comment PAP Smear Comment: . PATHOLOGIST PROVIDED ICD10:: Comment Note:: Comment Test Methodology: Comment     Health Maintenance Due  Topic Date Due   INFLUENZA VACCINE  07/25/2023   COVID-19 Vaccine (1 - 2024-25 season) Never done    The following portions of the patient's history were reviewed and updated as appropriate: allergies, current medications, past family history, past medical history, past social history, past surgical history and problem list. Problem list updated.  See flowsheet for other program required questions.  Objective:   Vitals:   01/29/24 1039  BP: 125/81  Pulse: 64  Weight: 172 lb 9.6 oz (78.3 kg)    Physical Exam Constitutional:      Appearance: Normal appearance. She is obese.  HENT:     Head: Normocephalic and atraumatic.  Mouth/Throat:     Mouth: Mucous membranes are moist.     Comments: Missing teeth; last dental exam 2023 Eyes:     Conjunctiva/sclera: Conjunctivae normal.  Neck:     Thyroid: No thyroid mass, thyromegaly or thyroid tenderness.  Cardiovascular:     Rate and Rhythm: Normal rate and regular rhythm.  Pulmonary:     Effort: Pulmonary effort is normal.     Breath sounds: Normal breath sounds.   Chest:  Breasts:    Right: Normal.     Left: Normal.  Abdominal:     Palpations: Abdomen is soft.     Comments: Soft without masses or tenderness  Genitourinary:    General: Normal vulva.     Exam position: Lithotomy position.     Pubic Area: No pubic lice.      Vagina: Normal.     Cervix: Normal.     Uterus: Normal.      Adnexa: Right adnexa normal and left adnexa normal.     Rectum: Normal.     Comments: No evidence of nits Pap done Ivan Roche chaperone for exam Musculoskeletal:        General: Normal range of motion.     Cervical back: Normal range of motion and neck supple.  Skin:    General: Skin is warm and dry.  Neurological:     Mental Status: She is alert.  Psychiatric:        Mood and Affect: Mood normal.     Assessment and Plan:  Cheryl Vasquez is a 43 y.o. female presenting to the [facility] Department for an initial annual wellness/contraceptive visit  Contraception counseling: Reviewed options based on patient desire and reproductive life plan. Patient is interested in Hormonal Implant. This is in situ provided to the patient today.  if not why not clearly documented  Risks, benefits, and typical effectiveness rates were reviewed.  Questions were answered.  Written information was also given to the patient to review.    The patient will follow up in  1 years for surveillance.  The patient was told to call with any further questions, or with any concerns about this method of contraception.  Emphasized use of condoms 100% of the time for STI prevention.  Educated on ECP and assessed for need of ECP. Patient reported > 120 hours .  Reviewed options and patient desired No method of ECP, declined all    1. Family planning (Primary) Treat wet mount per standing orders Immunization nurse consult Pt given BCCCP flyer and will call and schedule first mammogram  - WET PREP FOR TRICH, YEAST, CLUE - Hemoglobin, venipuncture - Syphilis  Serology, [facility] - HIV [place] [facility] - Chlamydia/Gonorrhea [facility] - IGP, Aptima HPV - Hgb A1c w/o eAG  2. Encounter for surveillance of implantable subdermal contraceptive Pt happy with Nexplanon inserted 2022  3. Atypical squamous cells of undetermined significance on cytologic smear of cervix (ASC-US ) 09/23/2020 Repeat cotest today done   No follow-ups on file.  No future appointments. Due to language barrier, a Spanish interpreter Kemp T.) was present in person during the history-taking, subsequent discussion, and physical exam with this patient.     Almarie DELENA Hillier, CNM

## 2024-01-30 LAB — HGB A1C W/O EAG: Hgb A1c MFr Bld: 5.3 % (ref 4.8–5.6)

## 2024-02-04 LAB — IGP, APTIMA HPV
HPV Aptima: NEGATIVE
PAP Smear Comment: 0

## 2024-02-07 ENCOUNTER — Telehealth: Payer: Self-pay

## 2024-02-07 NOTE — Telephone Encounter (Signed)
Call pt re pap results from 01/29/24 specimen and colpo referral. ASCUS Neg HPV  Confirm insurance status and discuss BCCCP.

## 2024-02-11 NOTE — Telephone Encounter (Signed)
 Phone call to pt with [employer] to  [phone]. Pt confirmed identity. Pt counseled re pap results and colpo referral.  Pt confirmed she does not have any kind of insurance. Desires to go through [employer] for colpo.  Pt requested STD results from 01/29/24, following provided by RN: GC/CT vaginal= negative HIV=  non reactive  Syphilis= non reactive Trich= negative

## 2024-02-11 NOTE — Telephone Encounter (Signed)
 02/11/24 Colpo referral sent to Joellyn Quails with BCCCP through [employer]

## 2024-03-24 ENCOUNTER — Other Ambulatory Visit: Payer: Self-pay

## 2024-03-24 ENCOUNTER — Ambulatory Visit: Payer: Self-pay | Attending: Hematology and Oncology | Admitting: Hematology and Oncology

## 2024-03-24 ENCOUNTER — Other Ambulatory Visit (HOSPITAL_COMMUNITY)
Admission: RE | Admit: 2024-03-24 | Discharge: 2024-03-24 | Disposition: A | Discharge status: Home / Self Care | Payer: Self-pay | Source: Ambulatory Visit | Attending: Hematology and Oncology | Admitting: Hematology and Oncology

## 2024-03-24 ENCOUNTER — Other Ambulatory Visit: Payer: Self-pay | Admitting: Obstetrics and Gynecology

## 2024-03-24 VITALS — BP 141/91 | Wt 169.0 lb

## 2024-03-24 DIAGNOSIS — Z1231 Encounter for screening mammogram for malignant neoplasm of breast: Secondary | ICD-10-CM

## 2024-03-24 DIAGNOSIS — R87610 Atypical squamous cells of undetermined significance on cytologic smear of cervix (ASC-US): Secondary | ICD-10-CM

## 2024-03-24 DIAGNOSIS — Z1211 Encounter for screening for malignant neoplasm of colon: Secondary | ICD-10-CM

## 2024-03-24 DIAGNOSIS — Z01812 Encounter for preprocedural laboratory examination: Secondary | ICD-10-CM

## 2024-03-24 NOTE — Progress Notes (Signed)
 Ms. Cheryl Vasquez is a 43 y.o. female who presents to [facility] clinic today with no complaints.    Pap Smear: Pap not smear completed today. Last Pap smear was 01/29/2024 and was abnormal - ASCUS/ HPV- . Per patient has history  of an abnormal Pap smear. Last Pap smear result is available in Epic. 2021 - Normal/ HPV-;    Physical exam: Breasts No breast exam today.     Pelvic/Bimanual Pap is not indicated today   GYNECOLOGY CLINIC COLPOSCOPY PROCEDURE NOTE  Ms. Cheryl Vasquez is a 43 y.o. [id number] here for colposcopy for ASCUS with POSITIVE high risk HPV pap smear on 01/29/2024. Discussed role for HPV in cervical dysplasia, need for surveillance.  Patient given informed consent, signed copy in the chart, time out was performed.  Placed in lithotomy position. Cervix viewed with speculum and colposcope after application of acetic acid.   Colposcopy adequate? Yes  no visible lesions.  ECC specimen obtained. All specimens were labelled and sent to pathology.  Patient was given post procedure instructions.  Will follow up pathology and manage accordingly.  Routine preventative health maintenance measures emphasized.    Smoking History: Patient has never smoked and was referred to quit line.    Patient Navigation: Patient education provided. Access to services provided for patient through BCCCP program. Delos Haring interpreter provided. No transportation provided   Colorectal Cancer Screening: Per patient has never had colonoscopy completed No complaints today. FIT test given.    Breast and Cervical Cancer Risk Assessment: Patient does not have family history of breast cancer, known genetic mutations, or radiation treatment to the chest before age 62. Patient does not have history of cervical dysplasia, immunocompromised, or DES exposure in-utero.  Risk Assessment   No risk assessment data     A: BCCCP exam without pap smear No complaints. Colposcopy as above.    P: Referred patient to the [facility] for a screening mammogram. Will inform of results once pathology is obtained. If normal or low grade, will repeat Pap in one year. If high grade, will refer to gynecology.   Ilda Basset A, NP 03/24/2024 10:09 AM

## 2024-03-26 LAB — SURGICAL PATHOLOGY

## 2024-04-02 ENCOUNTER — Telehealth: Payer: Self-pay

## 2024-04-02 NOTE — Telephone Encounter (Signed)
 Using Cheryl Vasquez, I have attempted to reach this pt to review her COLPO results. There was no answer and we were unable to leave a message.

## 2024-04-02 NOTE — Telephone Encounter (Signed)
-----   Message from Pascal Lux sent at 03/26/2024  2:50 PM EDT ----- Repeat Pap smear in one year.

## 2024-04-07 ENCOUNTER — Other Ambulatory Visit: Payer: Self-pay | Admitting: Family Medicine

## 2024-04-07 NOTE — Progress Notes (Signed)
 Reviewed colposcopy results. Will need PAP in one year.   Tempie Fee, MD 04/07/24  2:12 PM

## 2024-04-07 NOTE — Progress Notes (Signed)
 Colpo completed by Adelaide Adjutant, NP with [facility] on 03/24/24.  Reference surgical pathology lab result in Epic dated 03/24/24.  Copied and pasted from [facility] notes: 03/24/24- If normal or low grade, will repeat Pap in one year. If high grade, will refer to gynecology.    Signed      ----- Message from Adelaide Adjutant sent at 03/26/2024  2:50 PM EDT ----- Repeat Pap smear in one year.

## 2024-04-08 ENCOUNTER — Ambulatory Visit: Admission: RE | Admit: 2024-04-08 | Payer: Self-pay | Source: Ambulatory Visit

## 2024-05-28 NOTE — Telephone Encounter (Signed)
 Using [employer], [place], I attempted to reach this pt regarding her COLPO results. The pt answered but after introducing myself, she hung up the phone. I attempted to call her back 3 times and was forwarded to voicemail each time. I have left a message requesting the pt call back.  I will mail the pt a letter.

## 2024-07-22 ENCOUNTER — Telehealth: Payer: Self-pay | Admitting: Family Medicine

## 2024-07-31 NOTE — Telephone Encounter (Signed)
 Return call to client with [facility] [employer] ID # [phone] regarding PAP follow-up and left message requesting she return RN call. Number to call provided. Burnadette Lowers, RN
